# Patient Record
Sex: Female | Born: 1980 | Race: Black or African American | Hispanic: No | Marital: Single | Smoking: Never smoker
Health system: Southern US, Community
[De-identification: ages and names within clinical notes are randomized; demographics above are authoritative.]

## PROBLEM LIST (undated history)

## (undated) ENCOUNTER — Inpatient Hospital Stay (HOSPITAL_COMMUNITY): Payer: Self-pay

## (undated) DIAGNOSIS — F329 Major depressive disorder, single episode, unspecified: Secondary | ICD-10-CM

## (undated) DIAGNOSIS — Z8669 Personal history of other diseases of the nervous system and sense organs: Secondary | ICD-10-CM

## (undated) DIAGNOSIS — F32A Depression, unspecified: Secondary | ICD-10-CM

## (undated) DIAGNOSIS — B009 Herpesviral infection, unspecified: Secondary | ICD-10-CM

## (undated) HISTORY — PX: INDUCED ABORTION: SHX677

## (undated) HISTORY — DX: Personal history of other diseases of the nervous system and sense organs: Z86.69

## (undated) HISTORY — PX: TUBAL LIGATION: SHX77

## (undated) HISTORY — DX: Herpesviral infection, unspecified: B00.9

## (undated) HISTORY — PX: NO PAST SURGERIES: SHX2092

---

## 2007-08-10 ENCOUNTER — Emergency Department (HOSPITAL_COMMUNITY): Admission: EM | Admit: 2007-08-10 | Discharge: 2007-08-10 | Payer: Self-pay | Admitting: Emergency Medicine

## 2009-02-01 ENCOUNTER — Inpatient Hospital Stay (HOSPITAL_COMMUNITY): Admission: AD | Admit: 2009-02-01 | Discharge: 2009-02-03 | Payer: Self-pay | Admitting: Obstetrics and Gynecology

## 2009-09-10 ENCOUNTER — Emergency Department (HOSPITAL_COMMUNITY): Admission: EM | Admit: 2009-09-10 | Discharge: 2009-09-10 | Payer: Self-pay | Admitting: Emergency Medicine

## 2010-06-02 LAB — WET PREP, GENITAL
Trich, Wet Prep: NONE SEEN
Yeast Wet Prep HPF POC: NONE SEEN

## 2010-06-02 LAB — RPR: RPR Ser Ql: NONREACTIVE

## 2010-06-02 LAB — CBC
MCHC: 33 g/dL (ref 30.0–36.0)
MCV: 92.5 fL (ref 78.0–100.0)
Platelets: 233 10*3/uL (ref 150–400)
Platelets: 268 10*3/uL (ref 150–400)
RDW: 13.8 % (ref 11.5–15.5)

## 2010-11-26 LAB — POCT PREGNANCY, URINE: Operator id: 257131

## 2011-10-21 ENCOUNTER — Encounter (HOSPITAL_COMMUNITY): Payer: Self-pay | Admitting: Emergency Medicine

## 2011-10-21 ENCOUNTER — Emergency Department (INDEPENDENT_AMBULATORY_CARE_PROVIDER_SITE_OTHER): Payer: Self-pay

## 2011-10-21 ENCOUNTER — Emergency Department (HOSPITAL_COMMUNITY)
Admission: EM | Admit: 2011-10-21 | Discharge: 2011-10-21 | Disposition: A | Discharge status: Home / Self Care | Payer: PRIVATE HEALTH INSURANCE | Source: Home / Self Care | Attending: Emergency Medicine | Admitting: Emergency Medicine

## 2011-10-21 DIAGNOSIS — IMO0002 Reserved for concepts with insufficient information to code with codable children: Secondary | ICD-10-CM

## 2011-10-21 DIAGNOSIS — S6000XA Contusion of unspecified finger without damage to nail, initial encounter: Secondary | ICD-10-CM

## 2011-10-21 DIAGNOSIS — S60419A Abrasion of unspecified finger, initial encounter: Secondary | ICD-10-CM

## 2011-10-21 MED ORDER — BACITRACIN ZINC 500 UNIT/GM EX OINT
TOPICAL_OINTMENT | Freq: Once | CUTANEOUS | Status: AC
  Administered 2011-10-21: 19:00:00 via TOPICAL

## 2011-10-21 NOTE — ED Provider Notes (Addendum)
History     CSN: [id number]  Arrival date & time 10/21/11  1750   First MD Initiated Contact with Patient 10/21/11 1752      Chief Complaint  Patient presents with  . Hand Pain    (Consider location/radiation/quality/duration/timing/severity/associated sxs/prior treatment) HPI Comments: Patient presents urgent care this evening after she has sustained an injury to right middle finger she accidentally closed a car of her door and slammed her right middle finger. Has a small wound laceration to the dorsal aspect of the right middle finger. She denies any numbness, or tingling sensation. Is able to move her finger but with discomfort.  Patient is a 31 y.o. female presenting with hand pain. The history is provided by the patient.  Hand Pain This is a new problem. The current episode started 3 to 5 hours ago. The problem occurs constantly. The problem has not changed since onset.Pertinent negatives include no chest pain, no abdominal pain, no headaches and no shortness of breath. Nothing aggravates the symptoms. She has tried nothing for the symptoms.    History reviewed. No pertinent past medical history.  History reviewed. No pertinent past surgical history.  History reviewed. No pertinent family history.  History  Substance Use Topics  . Smoking status: Never Smoker   . Smokeless tobacco: Not on file  . Alcohol Use: No    OB History    Grav Para Term Preterm Abortions TAB SAB Ect Mult Living                  Review of Systems  Constitutional: Positive for activity change. Negative for chills and appetite change.  Respiratory: Negative for shortness of breath.   Cardiovascular: Negative for chest pain.  Gastrointestinal: Negative for abdominal pain.  Musculoskeletal: Positive for joint swelling. Negative for myalgias, back pain, arthralgias and gait problem.  Skin: Positive for wound.  Neurological: Negative for headaches.    Allergies  Review of patient's allergies  indicates no known allergies.  Home Medications  No current outpatient prescriptions on file.  BP 94/54  Pulse 67  Temp 98.4 F (36.9 C) (Oral)  Resp 16  SpO2 67%  LMP 09/20/2011  Physical Exam  Nursing note and vitals reviewed. Constitutional: She appears well-developed and well-nourished.  Musculoskeletal: She exhibits tenderness.       Hands: Neurological: She is alert.  Skin: Skin is warm.    ED Course  Procedures (including critical care time)  Labs Reviewed - No data to display Dg Finger Middle Right  10/21/2011  *RADIOLOGY REPORT*  Clinical Data: Trauma today with distal pain.  RIGHT MIDDLE FINGER 2+V  Comparison: None.  Findings: No acute fracture or dislocation.  No radio-opaque foreign body.  IMPRESSION: No acute osseous abnormality.   Original Report Authenticated By: Consuello Bossier, M.D.      1. Abrasion of finger   2. Contusion of finger       MDM  Right middle finger contusion with superficial abrasion. Negative x-rays. Have provided a finger splint to be used for 5 days. We have discussed symptoms specially range of motion and that if any deficit or unable to fully extend and flex her third finger to return for recheck.     Jimmie Molly, MD 10/21/11 [place]  Jimmie Molly, MD 10/21/11 [phone]

## 2011-10-21 NOTE — ED Notes (Signed)
Closed right middle finger in car door around 4:30 p.m.Marland Kitchen  small amount of drainage from wound, laceration to base of nail, cuticle, finger

## 2011-10-21 NOTE — ED Notes (Signed)
Clarified length of time to remain in splint-dr coll reports 3 days.

## 2012-05-04 ENCOUNTER — Telehealth: Payer: Self-pay | Admitting: Obstetrics and Gynecology

## 2012-05-04 NOTE — Telephone Encounter (Signed)
VM from pt.05/03/12. Has appt 05/09/12. Needs RF Valacyclovir. Pt [id number].

## 2012-05-04 NOTE — Telephone Encounter (Signed)
VM from pt. 05/03/12. States has app 05/09/12. Needs Rf Valacyclovir.  Pt [id number].

## 2012-05-09 ENCOUNTER — Ambulatory Visit: Payer: PRIVATE HEALTH INSURANCE | Admitting: Obstetrics and Gynecology

## 2012-05-09 ENCOUNTER — Encounter: Payer: Self-pay | Admitting: Obstetrics and Gynecology

## 2012-05-09 VITALS — BP 108/70 | Temp 98.0°F | Ht 67.5 in | Wt 146.0 lb

## 2012-05-09 DIAGNOSIS — Z124 Encounter for screening for malignant neoplasm of cervix: Secondary | ICD-10-CM

## 2012-05-09 DIAGNOSIS — N926 Irregular menstruation, unspecified: Secondary | ICD-10-CM

## 2012-05-09 MED ORDER — ESTRADIOL 1 MG PO TABS: ORAL_TABLET | ORAL | Status: DC

## 2012-05-09 MED ORDER — ACYCLOVIR 200 MG PO CAPS: ORAL_CAPSULE | ORAL | Status: DC

## 2012-05-09 NOTE — Patient Instructions (Signed)
Avoid: - excess soap on genital area (consider using plain oatmeal soap) - use of powder or sprays in genital area - douching - wearing underwear to bed (except with menses) - using more than is directed detergent when washing clothes - tight fitting garments around genital area - excess sugar intake   

## 2012-05-09 NOTE — Progress Notes (Signed)
Regular Periods: yes Mammogram: no  Monthly Breast Ex.: yes Exercise: no  Tetanus < 10 years: yes Seatbelts: yes  NI. Bladder Functn.: yes weak bladder Abuse at home: no  Daily BM's: yes Stressful Work: no  Healthy Diet: yes Sigmoid-Colonoscopy: no  Calcium: no Medical problems this year: irregular bleeding and vaginal discharge   LAST PAP:8/12  Contraception: implanon  Mammogram:  no  PCP: no  PMH: no change  FMH: no change  Last Bone Scan: no  Pt is single.

## 2012-05-09 NOTE — Progress Notes (Signed)
Subjective:    [name] [name] is a 32 y.o. female, G6P0, who presents for an annual exam. The patient reports irregular bleeding (continues) on Implanon (expires August this year) and vaginal discharge.  Describes bleeding a the need to change pad 3 times a day x 2 weeks.  Denies urinary tract symptoms, change in bowel function or fever.  States that vaginal discharge is not smelly or itch.  Menstrual cycle:   LMP: Patient's last menstrual period was 04/13/2012.             Review of Systems Pertinent items are noted in HPI. Denies pelvic pain, urinary tract symptoms, vaginitis symptoms, irregular bleeding, menopausal symptoms, change in bowel habits or rectal bleeding   Objective:    BP 108/70  Temp(Src) 98 F (36.7 C) (Oral)  Ht 5' 7.5" (1.715 m)  Wt 146 lb (66.225 kg)  BMI 22.52 kg/m2  LMP 04/13/2012     Wt Readings from Last 1 Encounters:  05/09/12 146 lb (66.225 kg)   Body mass index is 22.52 kg/(m^2). General Appearance: Alert, no acute distress HEENT: Grossly normal Neck / Thyroid: Supple, no thyromegaly or cervical adenopathy Lungs: Clear to auscultation bilaterally Back: No CVA tenderness Breast Exam: No masses or nodes.No dimpling, nipple retraction or discharge. Cardiovascular: Regular rate and rhythm.  Gastrointestinal: Soft, non-tender, no masses or organomegaly Pelvic Exam: EGBUS-wnl, vagina-normal rugae, cervix- without lesions or tenderness, uterus appears normal size shape and consistency, adnexae-no masses or tenderness Lymphatic Exam: Non-palpable nodes in neck, clavicular,  axillary, or inguinal regions  Skin: no rashes but multiple scars on the legs Extremities: no clubbing cyanosis or edema  Neurologic: grossly normal Psychiatric: Alert and oriented  UPT-negative   Assessment:   Routine GYN Exam Irregular Bleeding on Implanon (expires August 2014)   Plan:  Estradiol 1 mg #21  1 po qd 1 refills  Refer to Plastic Surgeon for breast enhancement  surgery consult and to Dermatologist for management of scars on legs   PAP sent  RTO 1 year or prn  POWELL,ELMIRAPA-C

## 2012-05-10 LAB — PAP IG W/ RFLX HPV ASCU

## 2013-05-28 ENCOUNTER — Emergency Department (INDEPENDENT_AMBULATORY_CARE_PROVIDER_SITE_OTHER)
Admission: EM | Admit: 2013-05-28 | Discharge: 2013-05-28 | Disposition: A | Discharge status: Home / Self Care | Payer: PRIVATE HEALTH INSURANCE | Source: Home / Self Care | Attending: Emergency Medicine | Admitting: Emergency Medicine

## 2013-05-28 ENCOUNTER — Encounter (HOSPITAL_COMMUNITY): Payer: Self-pay | Admitting: Emergency Medicine

## 2013-05-28 ENCOUNTER — Emergency Department (INDEPENDENT_AMBULATORY_CARE_PROVIDER_SITE_OTHER): Payer: PRIVATE HEALTH INSURANCE

## 2013-05-28 DIAGNOSIS — F411 Generalized anxiety disorder: Secondary | ICD-10-CM

## 2013-05-28 DIAGNOSIS — F419 Anxiety disorder, unspecified: Secondary | ICD-10-CM

## 2013-05-28 MED ORDER — LORAZEPAM 1 MG PO TABS: 1.0000 mg | ORAL_TABLET | Freq: Three times a day (TID) | ORAL | Status: DC | PRN

## 2013-05-28 NOTE — Discharge Instructions (Signed)

## 2013-05-28 NOTE — ED Provider Notes (Signed)
Chief Complaint   Chief Complaint  Patient presents with  . Chest Pain    History of Present Illness    [name] [name] is a 33 year old female who around 1 PM today developed substernal chest tightness with radiation through to the back. She denies any pain. States it feels like a tightness. She feels mildly short of breath. She denies any coughing or wheezing the patient has had similar episodes in the past. She's felt mildly depressed, she's been under some stress from family issues, and has felt tired and rundown. She denies any fever, chills, headache, URI symptoms, coughing, wheezing, palpitations, dizziness, syncope, or GI symptoms.  Review of Systems    Other than noted above, the patient denies any of the following symptoms. Systemic:  No fever or chills. Pulmonary:  No cough, wheezing, shortness of breath, sputum production, hemoptysis. Cardiac:  No palpitations, rapid heartbeat, dizziness, presyncope or syncope. GI:  No abdominal pain, heartburn, nausea, or vomiting. Ext:  No leg pain or swelling.  PMFSH    Past medical history, family history, social history, meds, and allergies were reviewed.   Physical Exam     Vital signs:  BP 130/72  Pulse 74  Temp(Src) 98.6 F (37 C) (Oral)  Resp 18  SpO2 100% Gen:  Alert, oriented, in no distress, skin warm and dry. Eye:  PERRL, lids and conjunctivas normal.  Sclera non-icteric. ENT:  Mucous membranes moist, pharynx clear. Neck:  Supple, no adenopathy or tenderness.  No JVD. Lungs:  Clear to auscultation, no wheezes, rales or rhonchi.  No respiratory distress. Heart:  Regular rhythm.  No gallops, murmers, clicks or rubs. Chest:  No chest wall tenderness. Abdomen:  Soft, nontender, no organomegaly or mass.  Bowel sounds normal.  No pulsatile abdominal mass or bruit. Ext:  No edema.  No calf tenderness and Homann's sign negative.  Pulses full and equal. Skin:  Warm and dry.  No rash.  Radiology     Dg Chest 2  View  05/28/2013   CLINICAL DATA:  Chest tightness  EXAM: CHEST  2 VIEW  COMPARISON:  August 10, 2007  FINDINGS: Upper thoracic levoscoliosis with mid and lower thoracic dextroscoliosis is again noted. Lungs are clear. Heart size and pulmonary vascularity are normal. No adenopathy. No pneumothorax.  IMPRESSION: Scoliosis.  Lungs clear.   Electronically Signed   By: Bretta BangWilliam  Woodruff M.D.   On: 05/28/2013 15:41   I reviewed the images independently and personally and concur with the radiologist's findings.  Electrocardiogram     Date: 05/28/2013  Rate: 61  Rhythm: normal sinus rhythm  QRS Axis: normal  Intervals: normal  ST/T Wave abnormalities: normal  Conduction Disutrbances:none  Narrative Interpretation: Normal sinus rhythm, normal EKG.  Old EKG Reviewed: none available  Assessment     The encounter diagnosis was Anxiety.  No evidence of cardiopulmonary disease.  Plan     1.  Meds:  The following meds were prescribed:   Discharge Medication List as of 05/28/2013  3:56 PM    START taking these medications   Details  LORazepam (ATIVAN) 1 MG tablet Take 1 tablet (1 mg total) by mouth 3 (three) times daily as needed for anxiety., Starting 05/28/2013, Until Discontinued, Print        2.  Patient Education/Counseling:  The patient was given appropriate handouts, self care instructions, and instructed in symptomatic relief.    3.  Follow up:  The patient was told to follow up here if no better in 3 to 4 days, or sooner if becoming worse in any way, and give an an some red flag symptoms such as worsening pain, shortness of breath, dizziness, or passing out which would prompt immediate return. Followup with primary care doctor within the next week.     Reuben Likes, MD 05/28/13 2157

## 2013-05-28 NOTE — ED Notes (Signed)
Pt  Reports        midsternal chest   Pain             That  Started  Today    At  Work             She  Reports         Feels  Like  Her  Chest is  Caving in   She       Ambulated  To  Room  With a  Steady  Fluid  Gait  He  Skin is  Warm  And  Dry       She  Is  Speaking in  Complete  sentances

## 2013-10-30 LAB — OB RESULTS CONSOLE HEPATITIS B SURFACE ANTIGEN: Hepatitis B Surface Ag: NEGATIVE

## 2013-10-30 LAB — OB RESULTS CONSOLE ABO/RH: RH Type: POSITIVE

## 2013-10-30 LAB — OB RESULTS CONSOLE GC/CHLAMYDIA
CHLAMYDIA, DNA PROBE: NEGATIVE
Gonorrhea: NEGATIVE

## 2013-10-30 LAB — OB RESULTS CONSOLE ANTIBODY SCREEN: Antibody Screen: NEGATIVE

## 2013-10-30 LAB — OB RESULTS CONSOLE RUBELLA ANTIBODY, IGM: Rubella: IMMUNE

## 2013-10-30 LAB — OB RESULTS CONSOLE HIV ANTIBODY (ROUTINE TESTING): HIV: NONREACTIVE

## 2013-10-30 LAB — OB RESULTS CONSOLE RPR: RPR: NONREACTIVE

## 2013-12-31 ENCOUNTER — Encounter (HOSPITAL_COMMUNITY): Payer: Self-pay | Admitting: Emergency Medicine

## 2014-02-13 LAB — OB RESULTS CONSOLE GBS: GBS: NEGATIVE

## 2014-03-01 NOTE — L&D Delivery Note (Signed)
Delivery Note At 11:25 PM a viable female was delivered via Vaginal, Spontaneous Delivery (Presentation: ;  ).  APGAR: , ; weight  .   Placenta status: , .  Cord:  with the following complications: .  Cord pH: not done  Anesthesia: Epidural  Episiotomy:   Lacerations:   Suture Repair: 2.0 Est. Blood Loss (mL):    Mom to postpartum.  Baby to Couplet care / Skin to Skin.  Shona Pardo A 04/30/2014, 11:34 PM

## 2014-04-02 ENCOUNTER — Inpatient Hospital Stay (HOSPITAL_COMMUNITY)
Admission: AD | Admit: 2014-04-02 | Discharge: 2014-04-02 | Disposition: A | Discharge status: Home / Self Care | Payer: Commercial Managed Care - PPO | Source: Ambulatory Visit | Attending: Obstetrics | Admitting: Obstetrics

## 2014-04-02 ENCOUNTER — Encounter (HOSPITAL_COMMUNITY): Payer: Self-pay | Admitting: *Deleted

## 2014-04-02 DIAGNOSIS — O4703 False labor before 37 completed weeks of gestation, third trimester: Secondary | ICD-10-CM | POA: Diagnosis not present

## 2014-04-02 DIAGNOSIS — O26899 Other specified pregnancy related conditions, unspecified trimester: Secondary | ICD-10-CM

## 2014-04-02 DIAGNOSIS — Z3A36 36 weeks gestation of pregnancy: Secondary | ICD-10-CM | POA: Diagnosis not present

## 2014-04-02 DIAGNOSIS — O9989 Other specified diseases and conditions complicating pregnancy, childbirth and the puerperium: Secondary | ICD-10-CM | POA: Diagnosis not present

## 2014-04-02 DIAGNOSIS — R109 Unspecified abdominal pain: Secondary | ICD-10-CM

## 2014-04-02 HISTORY — DX: Major depressive disorder, single episode, unspecified: F32.9

## 2014-04-02 HISTORY — DX: Herpesviral infection, unspecified: B00.9

## 2014-04-02 HISTORY — DX: Depression, unspecified: F32.A

## 2014-04-02 LAB — URINALYSIS, ROUTINE W REFLEX MICROSCOPIC
BILIRUBIN URINE: NEGATIVE
GLUCOSE, UA: NEGATIVE mg/dL
Hgb urine dipstick: NEGATIVE
KETONES UR: 15 mg/dL — AB
Nitrite: NEGATIVE
PROTEIN: NEGATIVE mg/dL
UROBILINOGEN UA: 2 mg/dL — AB (ref 0.0–1.0)
pH: 6 (ref 5.0–8.0)

## 2014-04-02 LAB — URINE MICROSCOPIC-ADD ON

## 2014-04-02 NOTE — MAU Note (Addendum)
Pain in rt lower abd, not having it now. Comes and goes.  Started when she was at work

## 2014-04-02 NOTE — MAU Provider Note (Signed)
  History     CSN: [id number]  Arrival date and time: 04/02/14 1339   None     Chief Complaint  Patient presents with  . Abdominal Pain   HPI [name] [name] 34 y.o. [id number] @[redacted]w[redacted]d  presents to MAU complaining of sharp pains on right lower abdomen - feeling tightness x 1 hour.  She believes they are Braxton-Hicks contractions.  She denies nausea, vomiting, vaginal bleeding, LOF, dysuria.  Endorses good fetal movement and complains of feet swelling from long periods standing. OB History    Gravida Para Term Preterm AB TAB SAB Ectopic Multiple Living   8 4 4  0 3 3 0   4      Past Medical History  Diagnosis Date  . HSV-2 infection   . Hx of migraines   . Depression     ok now  . Herpes     on valtrex    Past Surgical History  Procedure Laterality Date  . Induced abortion      3 elective  . No past surgeries      Family History  Problem Relation Age of Onset  . Stroke Father   . Diabetes Brother   . Leukemia Maternal Aunt   . Diabetes Paternal Uncle   . Pneumonia Maternal Grandmother   . Cancer Maternal Grandfather     lungs  . Diabetes Paternal Grandmother   . Diabetes Paternal Grandfather     History  Substance Use Topics  . Smoking status: Never Smoker   . Smokeless tobacco: Never Used  . Alcohol Use: No    Allergies: No Known Allergies  Prescriptions prior to admission  Medication Sig Dispense Refill Last Dose  . acyclovir (ZOVIRAX) 200 MG capsule 1 po tid x 5 days as needed. 30 capsule 11   . estradiol (ESTRACE) 1 MG tablet 1 po daily x 21 days, stop x 7 days then repeat for another 21 days 21 tablet 1   . LORazepam (ATIVAN) 1 MG tablet Take 1 tablet (1 mg total) by mouth 3 (three) times daily as needed for anxiety. 30 tablet 0     ROS Pertinent ROS in HPI   Physical Exam   Blood pressure 112/70, pulse 75, temperature 97.8 F (36.6 C), temperature source Oral, resp. rate 18, weight 157 lb (71.215 kg).  Physical Exam  Constitutional: She is  oriented to person, place, and time. She appears well-developed and well-nourished.  HENT:  Head: Normocephalic and atraumatic.  Eyes: EOM are normal.  Cardiovascular: Normal rate and regular rhythm.   Respiratory: Effort normal and breath sounds normal. No respiratory distress.  GI: Soft. Bowel sounds are normal. She exhibits no distension. There is no tenderness. There is no rebound and no guarding.  Musculoskeletal: Normal range of motion.  Neurological: She is alert and oriented to person, place, and time.  Skin: Skin is warm and dry.  Psychiatric: She has a normal mood and affect.   Fetal Tracing: Baseline:125 Variability:mod Accelerations: 15x15 Decelerations:none Toco:irritable   MAU Course  Procedures  MDM Pain resolved.  No further concerns.   Fetal tracing is reactive.    Assessment and Plan  A:  1. Abdominal pain in pregnancy   Braxton Hicks  P: Discharge to home F/u in clinic as scheduled/as needed Encourage po hydration Patient may return to MAU as needed or if her condition were to change or worsen   Bertram Denvereague Clark, Karen E 04/02/2014, 2:28 PM

## 2014-04-02 NOTE — Discharge Instructions (Signed)
Abdominal Pain During Pregnancy °Belly (abdominal) pain is common during pregnancy. Most of the time, it is not a serious problem. Other times, it can be a sign that something is wrong with the pregnancy. Always tell your doctor if you have belly pain. °HOME CARE °Monitor your belly pain for any changes. The following actions may help you feel better: °· Do not have sex (intercourse) or put anything in your vagina until you feel better. °· Rest until your pain stops. °· Drink clear fluids if you feel sick to your stomach (nauseous). Do not eat solid food until you feel better. °· Only take medicine as told by your doctor. °· Keep all doctor visits as told. °GET HELP RIGHT AWAY IF:  °· You are bleeding, leaking fluid, or pieces of tissue come out of your vagina. °· You have more pain or cramping. °· You keep throwing up (vomiting). °· You have pain when you pee (urinate) or have blood in your pee. °· You have a fever. °· You do not feel your baby moving as much. °· You feel very weak or feel like passing out. °· You have trouble breathing, with or without belly pain. °· You have a very bad headache and belly pain. °· You have fluid leaking from your vagina and belly pain. °· You keep having watery poop (diarrhea). °· Your belly pain does not go away after resting, or the pain gets worse. °MAKE SURE YOU:  °· Understand these instructions. °· Will watch your condition. °· Will get help right away if you are not doing well or get worse. °Document Released: 02/03/2009 Document Revised: 10/18/2012 Document Reviewed: 09/14/2012 °[employer]® Patient Information ©2015 [employer]. This information is not intended to replace advice given to you by your health care provider. Make sure you discuss any questions you have with your health care provider. ° °

## 2014-04-26 ENCOUNTER — Inpatient Hospital Stay (HOSPITAL_COMMUNITY)
Admission: AD | Admit: 2014-04-26 | Discharge: 2014-04-26 | Disposition: A | Discharge status: Home / Self Care | Payer: Commercial Managed Care - PPO | Source: Ambulatory Visit | Attending: Obstetrics | Admitting: Obstetrics

## 2014-04-26 ENCOUNTER — Encounter (HOSPITAL_COMMUNITY): Payer: Self-pay | Admitting: *Deleted

## 2014-04-26 DIAGNOSIS — Z3A38 38 weeks gestation of pregnancy: Secondary | ICD-10-CM | POA: Insufficient documentation

## 2014-04-26 DIAGNOSIS — O471 False labor at or after 37 completed weeks of gestation: Secondary | ICD-10-CM | POA: Insufficient documentation

## 2014-04-26 NOTE — MAU Note (Signed)
Pt presents to MAU with complaints of contractions that started this morning around 8. Denies any vaginal bleeding, reports some LOF

## 2014-04-26 NOTE — Discharge Instructions (Signed)
Braxton Hicks Contractions °Contractions of the uterus can occur throughout pregnancy. Contractions are not always a sign that you are in labor.  °WHAT ARE BRAXTON HICKS CONTRACTIONS?  °Contractions that occur before labor are called Braxton Hicks contractions, or false labor. Toward the end of pregnancy (32-34 weeks), these contractions can develop more often and may become more forceful. This is not true labor because these contractions do not result in opening (dilatation) and thinning of the cervix. They are sometimes difficult to tell apart from true labor because these contractions can be forceful and people have different pain tolerances. You should not feel embarrassed if you go to the hospital with false labor. Sometimes, the only way to tell if you are in true labor is for your health care provider to look for changes in the cervix. °If there are no prenatal problems or other health problems associated with the pregnancy, it is completely safe to be sent home with false labor and await the onset of true labor. °HOW CAN YOU TELL THE DIFFERENCE BETWEEN TRUE AND FALSE LABOR? °False Labor °· The contractions of false labor are usually shorter and not as hard as those of true labor.   °· The contractions are usually irregular.   °· The contractions are often felt in the front of the lower abdomen and in the groin.   °· The contractions may go away when you walk around or change positions while lying down.   °· The contractions get weaker and are shorter lasting as time goes on.   °· The contractions do not usually become progressively stronger, regular, and closer together as with true labor.   °True Labor °1. Contractions in true labor last 30-70 seconds, become very regular, usually become more intense, and increase in frequency.   °2. The contractions do not go away with walking.   °3. The discomfort is usually felt in the top of the uterus and spreads to the lower abdomen and low back.   °4. True labor can  be determined by your health care provider with an exam. This will show that the cervix is dilating and getting thinner.   °WHAT TO REMEMBER °· Keep up with your usual exercises and follow other instructions given by your health care provider.   °· Take medicines as directed by your health care provider.   °· Keep your regular prenatal appointments.   °· Eat and drink lightly if you think you are going into labor.   °· If Braxton Hicks contractions are making you uncomfortable:   °· Change your position from lying down or resting to walking, or from walking to resting.   °· Sit and rest in a tub of warm water.   °· Drink 2-3 glasses of water. Dehydration may cause these contractions.   °· Do slow and deep breathing several times an hour.   °WHEN SHOULD I SEEK IMMEDIATE MEDICAL CARE? °Seek immediate medical care if: °· Your contractions become stronger, more regular, and closer together.   °· You have fluid leaking or gushing from your vagina.   °· You have a fever.   °· You pass blood-tinged mucus.   °· You have vaginal bleeding.   °· You have continuous abdominal pain.   °· You have low back pain that you never had before.   °· You feel your baby's head pushing down and causing pelvic pressure.   °· Your baby is not moving as much as it used to.   °Document Released: 02/15/2005 Document Revised: 02/20/2013 Document Reviewed: 11/27/2012 °[employer]® Patient Information ©2015 [employer]. This information is not intended to replace advice given to you by your health care   provider. Make sure you discuss any questions you have with your health care provider. ° °Fetal Movement Counts °Patient Name: __________________________________________________ Patient Due Date: ____________________ °Performing a fetal movement count is highly recommended in high-risk pregnancies, but it is good for every pregnant woman to do. Your health care provider may ask you to start counting fetal movements at 28 weeks of the pregnancy. Fetal  movements often increase: °· After eating a full meal. °· After physical activity. °· After eating or drinking something sweet or cold. °· At rest. °Pay attention to when you feel the baby is most active. This will help you notice a pattern of your baby's sleep and wake cycles and what factors contribute to an increase in fetal movement. It is important to perform a fetal movement count at the same time each day when your baby is normally most active.  °HOW TO COUNT FETAL MOVEMENTS °5. Find a quiet and comfortable area to sit or lie down on your left side. Lying on your left side provides the best blood and oxygen circulation to your baby. °6. Write down the day and time on a sheet of paper or in a journal. °7. Start counting kicks, flutters, swishes, rolls, or jabs in a 2-hour period. You should feel at least 10 movements within 2 hours. °8. If you do not feel 10 movements in 2 hours, wait 2-3 hours and count again. Look for a change in the pattern or not enough counts in 2 hours. °SEEK MEDICAL CARE IF: °· You feel less than 10 counts in 2 hours, tried twice. °· There is no movement in over an hour. °· The pattern is changing or taking longer each day to reach 10 counts in 2 hours. °· You feel the baby is not moving as he or she usually does. °Date: ____________ Movements: ____________ Start time: ____________ Finish time: ____________  °Date: ____________ Movements: ____________ Start time: ____________ Finish time: ____________ °Date: ____________ Movements: ____________ Start time: ____________ Finish time: ____________ °Date: ____________ Movements: ____________ Start time: ____________ Finish time: ____________ °Date: ____________ Movements: ____________ Start time: ____________ Finish time: ____________ °Date: ____________ Movements: ____________ Start time: ____________ Finish time: ____________ °Date: ____________ Movements: ____________ Start time: ____________ Finish time: ____________ °Date: ____________  Movements: ____________ Start time: ____________ Finish time: ____________  °Date: ____________ Movements: ____________ Start time: ____________ Finish time: ____________ °Date: ____________ Movements: ____________ Start time: ____________ Finish time: ____________ °Date: ____________ Movements: ____________ Start time: ____________ Finish time: ____________ °Date: ____________ Movements: ____________ Start time: ____________ Finish time: ____________ °Date: ____________ Movements: ____________ Start time: ____________ Finish time: ____________ °Date: ____________ Movements: ____________ Start time: ____________ Finish time: ____________ °Date: ____________ Movements: ____________ Start time: ____________ Finish time: ____________  °Date: ____________ Movements: ____________ Start time: ____________ Finish time: ____________ °Date: ____________ Movements: ____________ Start time: ____________ Finish time: ____________ °Date: ____________ Movements: ____________ Start time: ____________ Finish time: ____________ °Date: ____________ Movements: ____________ Start time: ____________ Finish time: ____________ °Date: ____________ Movements: ____________ Start time: ____________ Finish time: ____________ °Date: ____________ Movements: ____________ Start time: ____________ Finish time: ____________ °Date: ____________ Movements: ____________ Start time: ____________ Finish time: ____________  °Date: ____________ Movements: ____________ Start time: ____________ Finish time: ____________ °Date: ____________ Movements: ____________ Start time: ____________ Finish time: ____________ °Date: ____________ Movements: ____________ Start time: ____________ Finish time: ____________ °Date: ____________ Movements: ____________ Start time: ____________ Finish time: ____________ °Date: ____________ Movements: ____________ Start time: ____________ Finish time: ____________ °Date: ____________ Movements: ____________ Start time:  ____________ Finish time: ____________ °Date: ____________ Movements:   ____________ Start time: ____________ Finish time: ____________  °Date: ____________ Movements: ____________ Start time: ____________ Finish time: ____________ °Date: ____________ Movements: ____________ Start time: ____________ Finish time: ____________ °Date: ____________ Movements: ____________ Start time: ____________ Finish time: ____________ °Date: ____________ Movements: ____________ Start time: ____________ Finish time: ____________ °Date: ____________ Movements: ____________ Start time: ____________ Finish time: ____________ °Date: ____________ Movements: ____________ Start time: ____________ Finish time: ____________ °Date: ____________ Movements: ____________ Start time: ____________ Finish time: ____________  °Date: ____________ Movements: ____________ Start time: ____________ Finish time: ____________ °Date: ____________ Movements: ____________ Start time: ____________ Finish time: ____________ °Date: ____________ Movements: ____________ Start time: ____________ Finish time: ____________ °Date: ____________ Movements: ____________ Start time: ____________ Finish time: ____________ °Date: ____________ Movements: ____________ Start time: ____________ Finish time: ____________ °Date: ____________ Movements: ____________ Start time: ____________ Finish time: ____________ °Date: ____________ Movements: ____________ Start time: ____________ Finish time: ____________  °Date: ____________ Movements: ____________ Start time: ____________ Finish time: ____________ °Date: ____________ Movements: ____________ Start time: ____________ Finish time: ____________ °Date: ____________ Movements: ____________ Start time: ____________ Finish time: ____________ °Date: ____________ Movements: ____________ Start time: ____________ Finish time: ____________ °Date: ____________ Movements: ____________ Start time: ____________ Finish time: ____________ °Date:  ____________ Movements: ____________ Start time: ____________ Finish time: ____________ °Date: ____________ Movements: ____________ Start time: ____________ Finish time: ____________  °Date: ____________ Movements: ____________ Start time: ____________ Finish time: ____________ °Date: ____________ Movements: ____________ Start time: ____________ Finish time: ____________ °Date: ____________ Movements: ____________ Start time: ____________ Finish time: ____________ °Date: ____________ Movements: ____________ Start time: ____________ Finish time: ____________ °Date: ____________ Movements: ____________ Start time: ____________ Finish time: ____________ °Date: ____________ Movements: ____________ Start time: ____________ Finish time: ____________ °Document Released: 03/17/2006 Document Revised: 07/02/2013 Document Reviewed: 12/13/2011 °[employer]® Patient Information ©2015 [employer]. This information is not intended to replace advice given to you by your health care provider. Make sure you discuss any questions you have with your health care provider. ° °

## 2014-04-30 ENCOUNTER — Inpatient Hospital Stay (HOSPITAL_COMMUNITY): Payer: Medicaid Other | Admitting: Anesthesiology

## 2014-04-30 ENCOUNTER — Inpatient Hospital Stay (HOSPITAL_COMMUNITY)
Admission: AD | Admit: 2014-04-30 | Discharge: 2014-05-02 | DRG: 775 | Disposition: A | Discharge status: Home / Self Care | Payer: Medicaid Other | Source: Ambulatory Visit | Attending: Obstetrics | Admitting: Obstetrics

## 2014-04-30 ENCOUNTER — Encounter (HOSPITAL_COMMUNITY): Payer: Self-pay | Admitting: *Deleted

## 2014-04-30 DIAGNOSIS — O09523 Supervision of elderly multigravida, third trimester: Secondary | ICD-10-CM | POA: Diagnosis not present

## 2014-04-30 DIAGNOSIS — Z3483 Encounter for supervision of other normal pregnancy, third trimester: Secondary | ICD-10-CM | POA: Diagnosis present

## 2014-04-30 DIAGNOSIS — Z3A40 40 weeks gestation of pregnancy: Secondary | ICD-10-CM | POA: Diagnosis present

## 2014-04-30 DIAGNOSIS — O480 Post-term pregnancy: Principal | ICD-10-CM | POA: Diagnosis present

## 2014-04-30 LAB — TYPE AND SCREEN
ABO/RH(D): A POS
Antibody Screen: NEGATIVE

## 2014-04-30 LAB — CBC
HCT: 37.1 % (ref 36.0–46.0)
HEMOGLOBIN: 12.1 g/dL (ref 12.0–15.0)
MCH: 30.3 pg (ref 26.0–34.0)
MCHC: 32.6 g/dL (ref 30.0–36.0)
MCV: 93 fL (ref 78.0–100.0)
Platelets: 218 10*3/uL (ref 150–400)
RBC: 3.99 MIL/uL (ref 3.87–5.11)
RDW: 15.2 % (ref 11.5–15.5)
WBC: 6.2 10*3/uL (ref 4.0–10.5)

## 2014-04-30 LAB — ABO/RH: ABO/RH(D): A POS

## 2014-04-30 MED ORDER — ACETAMINOPHEN 325 MG PO TABS: 650.0000 mg | ORAL_TABLET | ORAL | Status: DC | PRN

## 2014-04-30 MED ORDER — BUTORPHANOL TARTRATE 1 MG/ML IJ SOLN
1.0000 mg | INTRAMUSCULAR | Status: DC | PRN
  Administered 2014-04-30 (×2): 1 mg via INTRAVENOUS
  Filled 2014-04-30 (×2): qty 1

## 2014-04-30 MED ORDER — LACTATED RINGERS IV SOLN
INTRAVENOUS | Status: DC
  Administered 2014-04-30 (×2): via INTRAVENOUS

## 2014-04-30 MED ORDER — OXYCODONE-ACETAMINOPHEN 5-325 MG PO TABS: 1.0000 | ORAL_TABLET | ORAL | Status: DC | PRN

## 2014-04-30 MED ORDER — LIDOCAINE HCL (PF) 1 % IJ SOLN
INTRAMUSCULAR | Status: DC | PRN
  Administered 2014-04-30 (×2): 4 mL

## 2014-04-30 MED ORDER — EPHEDRINE 5 MG/ML INJ
10.0000 mg | INTRAVENOUS | Status: DC | PRN
  Filled 2014-04-30: qty 2

## 2014-04-30 MED ORDER — OXYTOCIN BOLUS FROM INFUSION: 500.0000 mL | INTRAVENOUS | Status: DC

## 2014-04-30 MED ORDER — LACTATED RINGERS IV SOLN: 500.0000 mL | Freq: Once | INTRAVENOUS | Status: DC

## 2014-04-30 MED ORDER — BUTORPHANOL TARTRATE 1 MG/ML IJ SOLN: 1.0000 mg | INTRAMUSCULAR | Status: DC | PRN

## 2014-04-30 MED ORDER — TERBUTALINE SULFATE 1 MG/ML IJ SOLN: 0.2500 mg | Freq: Once | INTRAMUSCULAR | Status: AC | PRN

## 2014-04-30 MED ORDER — CITRIC ACID-SODIUM CITRATE 334-500 MG/5ML PO SOLN
30.0000 mL | ORAL | Status: DC | PRN
  Administered 2014-04-30: 30 mL via ORAL
  Filled 2014-04-30: qty 15

## 2014-04-30 MED ORDER — OXYTOCIN 40 UNITS IN LACTATED RINGERS INFUSION - SIMPLE MED: 62.5000 mL/h | INTRAVENOUS | Status: DC

## 2014-04-30 MED ORDER — OXYTOCIN 40 UNITS IN LACTATED RINGERS INFUSION - SIMPLE MED
1.0000 m[IU]/min | INTRAVENOUS | Status: DC
  Administered 2014-04-30: 2 m[IU]/min via INTRAVENOUS
  Filled 2014-04-30: qty 1000

## 2014-04-30 MED ORDER — FENTANYL 2.5 MCG/ML BUPIVACAINE 1/10 % EPIDURAL INFUSION (WH - ANES)
INTRAMUSCULAR | Status: DC | PRN
  Administered 2014-04-30: 14 mL/h via EPIDURAL

## 2014-04-30 MED ORDER — LIDOCAINE HCL (PF) 1 % IJ SOLN
30.0000 mL | INTRAMUSCULAR | Status: DC | PRN
  Filled 2014-04-30: qty 30

## 2014-04-30 MED ORDER — DIPHENHYDRAMINE HCL 50 MG/ML IJ SOLN: 12.5000 mg | INTRAMUSCULAR | Status: DC | PRN

## 2014-04-30 MED ORDER — LIDOCAINE HCL (PF) 1 % IJ SOLN
INTRAMUSCULAR | Status: DC | PRN
  Administered 2014-04-30: 5 mL via SUBCUTANEOUS

## 2014-04-30 MED ORDER — PHENYLEPHRINE 40 MCG/ML (10ML) SYRINGE FOR IV PUSH (FOR BLOOD PRESSURE SUPPORT)
80.0000 ug | PREFILLED_SYRINGE | INTRAVENOUS | Status: DC | PRN
Start: 2014-04-30 — End: 2014-05-01
  Filled 2014-04-30: qty 2

## 2014-04-30 MED ORDER — PHENYLEPHRINE 40 MCG/ML (10ML) SYRINGE FOR IV PUSH (FOR BLOOD PRESSURE SUPPORT)
80.0000 ug | PREFILLED_SYRINGE | INTRAVENOUS | Status: DC | PRN
  Filled 2014-04-30: qty 2
  Filled 2014-04-30 (×2): qty 20

## 2014-04-30 MED ORDER — ONDANSETRON HCL 4 MG/2ML IJ SOLN: 4.0000 mg | Freq: Four times a day (QID) | INTRAMUSCULAR | Status: DC | PRN

## 2014-04-30 MED ORDER — LACTATED RINGERS IV SOLN: 500.0000 mL | INTRAVENOUS | Status: DC | PRN

## 2014-04-30 MED ORDER — OXYCODONE-ACETAMINOPHEN 5-325 MG PO TABS: 2.0000 | ORAL_TABLET | ORAL | Status: DC | PRN

## 2014-04-30 MED ORDER — FENTANYL 2.5 MCG/ML BUPIVACAINE 1/10 % EPIDURAL INFUSION (WH - ANES)
14.0000 mL/h | INTRAMUSCULAR | Status: DC | PRN
  Filled 2014-04-30 (×2): qty 125

## 2014-04-30 NOTE — Anesthesia Preprocedure Evaluation (Signed)
Anesthesia Evaluation  Patient identified by MRN, date of birth, ID band Patient awake    Reviewed: Allergy & Precautions, NPO status , Patient's Chart, lab work & pertinent test results  History of Anesthesia Complications Negative for: history of anesthetic complications  Airway Mallampati: II  TM Distance: >3 FB Neck ROM: Full    Dental no notable dental hx. (+) Dental Advisory Given   Pulmonary neg pulmonary ROS,  breath sounds clear to auscultation  Pulmonary exam normal       Cardiovascular negative cardio ROS  Rhythm:Regular Rate:Normal     Neuro/Psych PSYCHIATRIC DISORDERS Anxiety Depression negative neurological ROS     GI/Hepatic negative GI ROS, Neg liver ROS,   Endo/Other  negative endocrine ROS  Renal/GU negative Renal ROS  negative genitourinary   Musculoskeletal negative musculoskeletal ROS (+)   Abdominal   Peds negative pediatric ROS (+)  Hematology negative hematology ROS (+)   Anesthesia Other Findings   Reproductive/Obstetrics (+) Pregnancy                             Anesthesia Physical Anesthesia Plan  ASA: II  Anesthesia Plan: Epidural   Post-op Pain Management:    Induction:   Airway Management Planned:   Additional Equipment:   Intra-op Plan:   Post-operative Plan:   Informed Consent: I have reviewed the patients History and Physical, chart, labs and discussed the procedure including the risks, benefits and alternatives for the proposed anesthesia with the patient or authorized representative who has indicated his/her understanding and acceptance.   Dental advisory given  Plan Discussed with:   Anesthesia Plan Comments:         Anesthesia Quick Evaluation  

## 2014-04-30 NOTE — Anesthesia Procedure Notes (Signed)
Epidural Patient location during procedure: OB Start time: 04/30/2014 6:32 PM  Staffing Anesthesiologist: Felipe DroneJUDD, Aseret Hoffman JENNETTE Performed by: anesthesiologist   Preanesthetic Checklist Completed: patient identified, site marked, surgical consent, pre-op evaluation, timeout performed, IV checked, risks and benefits discussed and monitors and equipment checked  Epidural Patient position: sitting Prep: site prepped and draped and DuraPrep Patient monitoring: continuous pulse ox and blood pressure Approach: midline Location: L3-L4 Injection technique: LOR saline  Needle:  Needle type: Tuohy  Needle gauge: 17 G Needle length: 9 cm and 9 Needle insertion depth: 6 cm Catheter type: closed end flexible Catheter size: 19 Gauge Catheter at skin depth: 10 cm Test dose: negative  Assessment Events: blood not aspirated, injection not painful, no injection resistance, negative IV test and no paresthesia  Additional Notes Patient identified. Risks/Benefits/Options discussed with patient including but not limited to bleeding, infection, nerve damage, paralysis, failed block, incomplete pain control, headache, blood pressure changes, nausea, vomiting, reactions to medication both or allergic, itching and postpartum back pain. Confirmed with bedside nurse the patient's most recent platelet count. Confirmed with patient that they are not currently taking any anticoagulation, have any bleeding history or any family history of bleeding disorders. Patient expressed understanding and wished to proceed. All questions were answered. Sterile technique was used throughout the entire procedure. Please see nursing notes for vital signs. Test dose was given through epidural catheter and negative prior to continuing to dose epidural or start infusion. Warning signs of high block given to the patient including shortness of breath, tingling/numbness in hands, complete motor block, or any concerning symptoms with  instructions to call for help. Patient was given instructions on fall risk and not to get out of bed. All questions and concerns addressed with instructions to call with any issues or inadequate analgesia.

## 2014-04-30 NOTE — Anesthesia Procedure Notes (Signed)
Epidural Patient location during procedure: OB Start time: 04/30/2014 3:55 PM  Staffing Anesthesiologist: Karie SchwalbeJUDD, Kenya Shiraishi JENNETTE Performed by: anesthesiologist   Preanesthetic Checklist Completed: patient identified, site marked, surgical consent, pre-op evaluation, timeout performed, IV checked, risks and benefits discussed and monitors and equipment checked  Epidural Patient position: sitting Prep: site prepped and draped and DuraPrep Patient monitoring: continuous pulse ox and blood pressure Approach: midline Location: L3-L4  Needle:  Needle type: Tuohy  Needle gauge: 17 G Needle length: 9 cm and 9  Assessment Events: blood not aspirated, injection not painful, no injection resistance, negative IV test and no paresthesia  Additional Notes All questions and concerns address. Consent obtained. Patient reports prior to epidural placement that she was "angry" with the last anesthesiologist because she had to stick her more than once. The patient was heavily contracting. Explained to her that she would have contractions throughout the procedure. I waited until a contraction subsided. I numbed her skin with lidocaine and she was screaming, moving all over the bed, arching her back drastically in the opposite direction away from me. I stopped and discussed with her the proper position to facilitate placement of her epidural. Another contraction came and she expressed how much she was hurting. Waited until that contraction stopped and inserted the touhy. Not even one cm into the skin, the patient began moving and arching her back. I asked if this was pressure sensation or pain. She reports that it was pressure but she couldn't sit still. I gave her additional local anesthetic and continued to advance the touhy. Before I was able to get LOR she continued to arch her back and have contractions that were too painful for her to continue. I discussed with her that I would stay and continue with the  epidural but for her safety and mine she would have to remain as still as possible. She asked me to stop the epidural and therefore I did immediately. I told her I could provide more numbing medicine if it was a discomfort to her and would continue but only if she wanted me to. She reported to myself and the nurse that she did not want to continue with the epidural and that she only wanted to use IV pain medicine. I told the patient that we were immediately available should she change her mind. I asked if there was anything I could do to help her or ease her into position or facilitate epidural placement but she declined. I placed a bandaid on her needle insertion site.

## 2014-04-30 NOTE — Anesthesia Preprocedure Evaluation (Signed)
Anesthesia Evaluation  Patient identified by MRN, date of birth, ID band Patient awake    Reviewed: Allergy & Precautions, NPO status , Patient's Chart, lab work & pertinent test results  History of Anesthesia Complications Negative for: history of anesthetic complications  Airway Mallampati: II  TM Distance: >3 FB Neck ROM: Full    Dental no notable dental hx. (+) Dental Advisory Given   Pulmonary neg pulmonary ROS,  breath sounds clear to auscultation  Pulmonary exam normal       Cardiovascular negative cardio ROS  Rhythm:Regular Rate:Normal  Reports intermittent palpitations   Neuro/Psych PSYCHIATRIC DISORDERS Anxiety Depression negative neurological ROS     GI/Hepatic negative GI ROS, Neg liver ROS,   Endo/Other  negative endocrine ROS  Renal/GU negative Renal ROS  negative genitourinary   Musculoskeletal negative musculoskeletal ROS (+)   Abdominal   Peds negative pediatric ROS (+)  Hematology negative hematology ROS (+)   Anesthesia Other Findings   Reproductive/Obstetrics (+) Pregnancy                             Anesthesia Physical Anesthesia Plan  ASA: II  Anesthesia Plan: Epidural   Post-op Pain Management:    Induction:   Airway Management Planned:   Additional Equipment:   Intra-op Plan:   Post-operative Plan:   Informed Consent: I have reviewed the patients History and Physical, chart, labs and discussed the procedure including the risks, benefits and alternatives for the proposed anesthesia with the patient or authorized representative who has indicated his/her understanding and acceptance.   Dental advisory given  Plan Discussed with: CRNA  Anesthesia Plan Comments:         Anesthesia Quick Evaluation

## 2014-04-30 NOTE — Progress Notes (Signed)
After req epidural Dr. Gentry RochJudd at the bedside pt refusing to cooperate with Dr. Gentry RochJudd instructions and asking us to stop. Pt states "I will just get the IV stuff". Dr. Gentry RochJudd left without placing epidural.

## 2014-04-30 NOTE — Progress Notes (Signed)
Pitocin stopped per Dr. Gaynell FaceMarshall. Wants pts UC's to slow down and for pt to consider epidural.

## 2014-04-30 NOTE — H&P (Signed)
This is Dr. Francoise CeoBernard Marshall dictating the history and physical on  [name] [name]  she's a 34 year old gravida 8 para 4037 at 40 weeks and 4 days negative GBS she has a history of herpes no all breaks recently she's been on Valtrex 500 by mouth daily membranes ruptured spontaneously at 7:30 today fluids clear she is having occasional contractions cervix 2 cm 70% vertex -2 and she was started on low-dose Pitocin Past medical history history of herpes Past surgical history negative Social history negative System review negative Physical exam well-developed female in no distress HEENT negative Lungs clear to P&A Heart regular rhythm no murmurs no gallops Breasts negative Abdomen term Pelvic as described above Extremities negative

## 2014-05-01 LAB — CBC
HEMATOCRIT: 31.6 % — AB (ref 36.0–46.0)
Hemoglobin: 10.4 g/dL — ABNORMAL LOW (ref 12.0–15.0)
MCH: 30.3 pg (ref 26.0–34.0)
MCHC: 32.9 g/dL (ref 30.0–36.0)
MCV: 92.1 fL (ref 78.0–100.0)
Platelets: 187 10*3/uL (ref 150–400)
RBC: 3.43 MIL/uL — ABNORMAL LOW (ref 3.87–5.11)
RDW: 15.1 % (ref 11.5–15.5)
WBC: 10.7 10*3/uL — ABNORMAL HIGH (ref 4.0–10.5)

## 2014-05-01 LAB — RPR: RPR Ser Ql: NONREACTIVE

## 2014-05-01 MED ORDER — SENNOSIDES-DOCUSATE SODIUM 8.6-50 MG PO TABS
2.0000 | ORAL_TABLET | ORAL | Status: DC
  Administered 2014-05-01: 2 via ORAL
  Filled 2014-05-01: qty 2

## 2014-05-01 MED ORDER — OXYCODONE-ACETAMINOPHEN 5-325 MG PO TABS
1.0000 | ORAL_TABLET | ORAL | Status: DC | PRN
Start: 2014-05-01 — End: 2014-05-02
  Administered 2014-05-01 (×3): 1 via ORAL
  Filled 2014-05-01 (×3): qty 1

## 2014-05-01 MED ORDER — IBUPROFEN 600 MG PO TABS
600.0000 mg | ORAL_TABLET | Freq: Four times a day (QID) | ORAL | Status: DC
  Administered 2014-05-01 – 2014-05-02 (×5): 600 mg via ORAL
  Filled 2014-05-01 (×6): qty 1

## 2014-05-01 MED ORDER — DIPHENHYDRAMINE HCL 25 MG PO CAPS: 25.0000 mg | ORAL_CAPSULE | Freq: Four times a day (QID) | ORAL | Status: DC | PRN

## 2014-05-01 MED ORDER — DIBUCAINE 1 % RE OINT: 1.0000 "application " | TOPICAL_OINTMENT | RECTAL | Status: DC | PRN

## 2014-05-01 MED ORDER — TETANUS-DIPHTH-ACELL PERTUSSIS 5-2.5-18.5 LF-MCG/0.5 IM SUSP: 0.5000 mL | Freq: Once | INTRAMUSCULAR | Status: DC

## 2014-05-01 MED ORDER — ONDANSETRON HCL 4 MG/2ML IJ SOLN
4.0000 mg | INTRAMUSCULAR | Status: DC | PRN
Start: 2014-05-01 — End: 2014-05-02

## 2014-05-01 MED ORDER — WITCH HAZEL-GLYCERIN EX PADS: 1.0000 "application " | MEDICATED_PAD | CUTANEOUS | Status: DC | PRN

## 2014-05-01 MED ORDER — BENZOCAINE-MENTHOL 20-0.5 % EX AERO: 1.0000 "application " | INHALATION_SPRAY | CUTANEOUS | Status: DC | PRN

## 2014-05-01 MED ORDER — ONDANSETRON HCL 4 MG PO TABS: 4.0000 mg | ORAL_TABLET | ORAL | Status: DC | PRN

## 2014-05-01 MED ORDER — OXYCODONE-ACETAMINOPHEN 5-325 MG PO TABS
2.0000 | ORAL_TABLET | ORAL | Status: DC | PRN
Start: 2014-05-01 — End: 2014-05-02

## 2014-05-01 MED ORDER — LANOLIN HYDROUS EX OINT: TOPICAL_OINTMENT | CUTANEOUS | Status: DC | PRN

## 2014-05-01 MED ORDER — FERROUS SULFATE 325 (65 FE) MG PO TABS
325.0000 mg | ORAL_TABLET | Freq: Two times a day (BID) | ORAL | Status: DC
  Administered 2014-05-01 – 2014-05-02 (×3): 325 mg via ORAL
  Filled 2014-05-01 (×3): qty 1

## 2014-05-01 MED ORDER — ZOLPIDEM TARTRATE 5 MG PO TABS: 5.0000 mg | ORAL_TABLET | Freq: Every evening | ORAL | Status: DC | PRN

## 2014-05-01 MED ORDER — SIMETHICONE 80 MG PO CHEW: 80.0000 mg | CHEWABLE_TABLET | ORAL | Status: DC | PRN

## 2014-05-01 MED ORDER — PRENATAL MULTIVITAMIN CH
1.0000 | ORAL_TABLET | Freq: Every day | ORAL | Status: DC
  Administered 2014-05-01: 1 via ORAL
  Filled 2014-05-01: qty 1

## 2014-05-01 NOTE — Progress Notes (Signed)
Patient was seen taking a medication (blue pill), patient confirmed she was taking her daily prescribed medication valtrex.  This med is not ordered under her MAR.

## 2014-05-01 NOTE — Anesthesia Postprocedure Evaluation (Signed)
  Anesthesia Post-op Note  Patient: [name] [name]  Procedure(s) Performed: * No procedures listed *  Patient Location: Mother/Baby  Anesthesia Type:Epidural  Level of Consciousness: awake, alert , oriented and patient cooperative  Airway and Oxygen Therapy: Patient Spontanous Breathing  Post-op Pain: none  Post-op Assessment: Post-op Vital signs reviewed, Patient's Cardiovascular Status Stable, Respiratory Function Stable, Patent Airway, No headache, No backache, No residual numbness and No residual motor weakness  Post-op Vital Signs: Reviewed and stable  Last Vitals:  Filed Vitals:   05/01/14 0555  BP: 115/62  Pulse: 83  Temp: 36.8 C  Resp: 20    Complications: No apparent anesthesia complications

## 2014-05-01 NOTE — Progress Notes (Signed)
Patient ID: [name] [name], female   DOB: [birth year], 34 y.o.   MRN: [id number] Postpartum day one Vital signs normal Fundus firm Lochia moderate Doing well

## 2014-05-02 NOTE — Progress Notes (Signed)
UR chart review completed.  

## 2014-05-02 NOTE — Progress Notes (Signed)
Patient ID: [name] [name], female   DOB: 1980/05/26, 34 y.o.   MRN: [id number] Postpartum day 2 Vital signs normal Fundus firm Lochia moderate Legs negative doing well home today

## 2014-05-02 NOTE — Discharge Summary (Signed)
Obstetric Discharge Summary Reason for Admission: onset of labor Prenatal Procedures: none Intrapartum Procedures: spontaneous vaginal delivery Postpartum Procedures: none Complications-Operative and Postpartum: none HEMOGLOBIN  Date Value Ref Range Status  05/01/2014 10.4* 12.0 - 15.0 g/dL Final   HCT  Date Value Ref Range Status  05/01/2014 31.6* 36.0 - 46.0 % Final    Physical Exam:  General: alert Lochia: appropriate Uterine Fundus: firm Incision: healing well DVT Evaluation: No evidence of DVT seen on physical exam.  Discharge Diagnoses: Term Pregnancy-delivered  Discharge Information: Date: 05/02/2014 Activity: pelvic rest Diet: routine Medications: Percocet Condition: improved Instructions: refer to practice specific booklet Discharge to: home Follow-up Information    Follow up with [name] CosierMARSHALL,[name], MD.   Specialty:  Obstetrics and Gynecology   Contact information:   [address] [place] [id number] [phone]       Newborn Data: Live born female  Birth Weight: 8 lb 7.1 oz (3830 g) APGAR: 9, 9  Home with mother.  [name] [name] [name] 05/02/2014, 6:42 AM

## 2014-05-02 NOTE — Discharge Instructions (Signed)
Discharge instructions   You can wash your hair  Shower  Eat what you want  Drink what you want  See me in 6 weeks  Your ankles are going to swell more in the next 2 weeks than when pregnant  No sex for 6 weeks   Leighann Amadon A, MD 05/02/2014

## 2014-05-09 ENCOUNTER — Other Ambulatory Visit: Payer: Self-pay | Admitting: Obstetrics

## 2014-05-20 ENCOUNTER — Encounter (HOSPITAL_COMMUNITY)
Admission: RE | Admit: 2014-05-20 | Discharge: 2014-05-20 | Disposition: A | Discharge status: Home / Self Care | Payer: Medicaid Other | Source: Ambulatory Visit | Attending: Obstetrics | Admitting: Obstetrics

## 2014-05-20 ENCOUNTER — Encounter (HOSPITAL_COMMUNITY): Payer: Self-pay

## 2014-05-20 DIAGNOSIS — Z01812 Encounter for preprocedural laboratory examination: Secondary | ICD-10-CM | POA: Diagnosis not present

## 2014-05-20 LAB — CBC
HEMATOCRIT: 43 % (ref 36.0–46.0)
HEMOGLOBIN: 13.9 g/dL (ref 12.0–15.0)
MCH: 30.5 pg (ref 26.0–34.0)
MCHC: 32.3 g/dL (ref 30.0–36.0)
MCV: 94.3 fL (ref 78.0–100.0)
Platelets: 522 10*3/uL — ABNORMAL HIGH (ref 150–400)
RBC: 4.56 MIL/uL (ref 3.87–5.11)
RDW: 13.9 % (ref 11.5–15.5)
WBC: 5.3 10*3/uL (ref 4.0–10.5)

## 2014-05-20 NOTE — Patient Instructions (Signed)
   Your procedure is scheduled on: MARCH 30 AT 730AM  Enter through the Main Entr[facility] of [facility] at:  Pick up the phone at the desk and dial [phone] and inform us of your arrival.  Please call this number if you have any problems the morning of surgery: [phone]  Remember: Do not eat food after midnight:MARCH 29 Do not drink clear liquids after: [id number] Take these medicines the morning of surgery with a SIP OF WATER:  Do not wear jewelry, make-up, or FINGER nail polish No metal in your hair or on your body. Do not wear lotions, powders, perfumes.  You may wear deodorant.  Do not bring valuables to the hospital. Contacts, dentures or bridgework may not be worn into surgery.  Leave suitcase in the car. After Surgery it may be brought to your room. For patients being admitted to the hospital, checkout time is 11:00am the day of discharge.    Patients discharged on the day of surgery will not be allowed to drive home.

## 2014-05-27 NOTE — H&P (Signed)
NAME:  [name], [name]                    ACCOUNT NO.:  MEDICAL RECORD NO.:  LOCATION:                                 FACILITY:  PHYSICIAN:  Kathreen CosierBernard A. Emmalyne Giacomo, M.D.   DATE OF BIRTH:  DATE OF ADMISSION: DATE OF DISCHARGE:                             HISTORY & PHYSICAL   HISTORY OF PRESENT ILLNESS:  The patient is a 34 year old gravida 7, para 2-2-3-5, who desires sterilization.  She understands the procedure can fail resulting in pregnancy in the tube or uterus.  PAST MEDICAL HISTORY:  She has a history of herpes and takes Valtrex from time to time.  PAST SURGICAL HISTORY:  Negative.  SOCIAL HISTORY:  Negative.  SYSTEM REVIEW:  Noncontributory.  PHYSICAL EXAMINATION:  GENERAL:  Well-developed female, in no distress. HEENT:  Negative. LUNGS:  Clear to P and A. HEART:  Regular rhythm.  No murmurs, no gallops. BREASTS:  Negative. ABDOMEN:  Not distended, nontender.  No masses. PELVIC:  Uterus top normal size.  Negative adnexa.  Vagina, external genitalia negative. EXTREMITIES:  Negative.          ______________________________ Kathreen CosierBernard A. [name] [name], M.D.     BAM/MEDQ  D:  05/27/2014  T:  05/27/2014  Job:  [id number]

## 2014-05-29 ENCOUNTER — Encounter (HOSPITAL_COMMUNITY)
Admission: RE | Disposition: A | Discharge status: Home / Self Care | Payer: Self-pay | Source: Ambulatory Visit | Attending: Obstetrics

## 2014-05-29 ENCOUNTER — Ambulatory Visit (HOSPITAL_COMMUNITY): Payer: Commercial Managed Care - PPO | Admitting: Anesthesiology

## 2014-05-29 ENCOUNTER — Encounter (HOSPITAL_COMMUNITY): Payer: Self-pay | Admitting: Anesthesiology

## 2014-05-29 ENCOUNTER — Ambulatory Visit (HOSPITAL_COMMUNITY)
Admission: RE | Admit: 2014-05-29 | Discharge: 2014-05-29 | Disposition: A | Discharge status: Home / Self Care | Payer: Commercial Managed Care - PPO | Source: Ambulatory Visit | Attending: Obstetrics | Admitting: Obstetrics

## 2014-05-29 DIAGNOSIS — Z302 Encounter for sterilization: Secondary | ICD-10-CM | POA: Diagnosis not present

## 2014-05-29 DIAGNOSIS — F329 Major depressive disorder, single episode, unspecified: Secondary | ICD-10-CM | POA: Insufficient documentation

## 2014-05-29 HISTORY — PX: LAPAROSCOPIC TUBAL LIGATION: SHX1937

## 2014-05-29 LAB — PREGNANCY, URINE: Preg Test, Ur: NEGATIVE

## 2014-05-29 SURGERY — LIGATION, FALLOPIAN TUBE, LAPAROSCOPIC
Anesthesia: General | Site: Abdomen | Laterality: Bilateral

## 2014-05-29 MED ORDER — FENTANYL CITRATE 0.05 MG/ML IJ SOLN
INTRAMUSCULAR | Status: AC
  Filled 2014-05-29: qty 2

## 2014-05-29 MED ORDER — ONDANSETRON HCL 4 MG/2ML IJ SOLN
INTRAMUSCULAR | Status: AC
  Filled 2014-05-29: qty 2

## 2014-05-29 MED ORDER — ONDANSETRON HCL 4 MG/2ML IJ SOLN
INTRAMUSCULAR | Status: DC | PRN
  Administered 2014-05-29: 4 mg via INTRAVENOUS

## 2014-05-29 MED ORDER — OXYCODONE HCL 5 MG/5ML PO SOLN: 5.0000 mg | Freq: Once | ORAL | Status: DC | PRN

## 2014-05-29 MED ORDER — SCOPOLAMINE 1 MG/3DAYS TD PT72
MEDICATED_PATCH | TRANSDERMAL | Status: AC
  Filled 2014-05-29: qty 1

## 2014-05-29 MED ORDER — LIDOCAINE HCL (CARDIAC) 20 MG/ML IV SOLN
INTRAVENOUS | Status: DC | PRN
  Administered 2014-05-29: 30 mg via INTRAVENOUS

## 2014-05-29 MED ORDER — OXYCODONE-ACETAMINOPHEN 5-325 MG PO TABS
ORAL_TABLET | ORAL | Status: AC
  Filled 2014-05-29: qty 1

## 2014-05-29 MED ORDER — LIDOCAINE HCL (CARDIAC) 20 MG/ML IV SOLN
INTRAVENOUS | Status: AC
  Filled 2014-05-29: qty 5

## 2014-05-29 MED ORDER — METOCLOPRAMIDE HCL 5 MG/ML IJ SOLN: 10.0000 mg | Freq: Once | INTRAMUSCULAR | Status: DC | PRN

## 2014-05-29 MED ORDER — ROCURONIUM BROMIDE 100 MG/10ML IV SOLN
INTRAVENOUS | Status: DC | PRN
  Administered 2014-05-29: 5 mg via INTRAVENOUS
  Administered 2014-05-29: 3 mg via INTRAVENOUS

## 2014-05-29 MED ORDER — KETOROLAC TROMETHAMINE 30 MG/ML IJ SOLN
INTRAMUSCULAR | Status: DC | PRN
  Administered 2014-05-29: 30 mg via INTRAVENOUS

## 2014-05-29 MED ORDER — ROCURONIUM BROMIDE 100 MG/10ML IV SOLN
INTRAVENOUS | Status: AC
  Filled 2014-05-29: qty 1

## 2014-05-29 MED ORDER — SUCCINYLCHOLINE CHLORIDE 20 MG/ML IJ SOLN
INTRAMUSCULAR | Status: AC
  Filled 2014-05-29: qty 1

## 2014-05-29 MED ORDER — FLUMAZENIL 0.5 MG/5ML IV SOLN
INTRAVENOUS | Status: DC | PRN
  Administered 2014-05-29: 0.2 mg via INTRAVENOUS

## 2014-05-29 MED ORDER — LACTATED RINGERS IV SOLN
INTRAVENOUS | Status: DC
  Administered 2014-05-29: 10 mL/h via INTRAVENOUS

## 2014-05-29 MED ORDER — PROPOFOL 10 MG/ML IV BOLUS
INTRAVENOUS | Status: DC | PRN
  Administered 2014-05-29: 180 mg via INTRAVENOUS

## 2014-05-29 MED ORDER — FENTANYL CITRATE 0.05 MG/ML IJ SOLN: 25.0000 ug | INTRAMUSCULAR | Status: DC | PRN

## 2014-05-29 MED ORDER — MIDAZOLAM HCL 2 MG/2ML IJ SOLN
INTRAMUSCULAR | Status: DC | PRN
  Administered 2014-05-29: 1 mg via INTRAVENOUS

## 2014-05-29 MED ORDER — KETOROLAC TROMETHAMINE 30 MG/ML IJ SOLN
INTRAMUSCULAR | Status: AC
  Filled 2014-05-29: qty 1

## 2014-05-29 MED ORDER — OXYCODONE-ACETAMINOPHEN 5-325 MG PO TABS
1.0000 | ORAL_TABLET | Freq: Once | ORAL | Status: AC
  Administered 2014-05-29: 1 via ORAL

## 2014-05-29 MED ORDER — MIDAZOLAM HCL 2 MG/2ML IJ SOLN
INTRAMUSCULAR | Status: AC
  Filled 2014-05-29: qty 2

## 2014-05-29 MED ORDER — DEXAMETHASONE SODIUM PHOSPHATE 10 MG/ML IJ SOLN
INTRAMUSCULAR | Status: DC | PRN
  Administered 2014-05-29: 4 mg via INTRAVENOUS

## 2014-05-29 MED ORDER — SCOPOLAMINE 1 MG/3DAYS TD PT72
1.0000 | MEDICATED_PATCH | Freq: Once | TRANSDERMAL | Status: DC
  Administered 2014-05-29: 1.5 mg via TRANSDERMAL

## 2014-05-29 MED ORDER — SUCCINYLCHOLINE CHLORIDE 20 MG/ML IJ SOLN
INTRAMUSCULAR | Status: DC | PRN
  Administered 2014-05-29: 20 mg via INTRAVENOUS
  Administered 2014-05-29: 40 mg via INTRAVENOUS
  Administered 2014-05-29: 120 mg via INTRAVENOUS

## 2014-05-29 MED ORDER — FLUMAZENIL 0.5 MG/5ML IV SOLN
INTRAVENOUS | Status: AC
  Filled 2014-05-29: qty 5

## 2014-05-29 MED ORDER — OXYCODONE HCL 5 MG PO TABS: 5.0000 mg | ORAL_TABLET | Freq: Once | ORAL | Status: DC | PRN

## 2014-05-29 MED ORDER — PROPOFOL 10 MG/ML IV BOLUS
INTRAVENOUS | Status: AC
  Filled 2014-05-29: qty 20

## 2014-05-29 MED ORDER — MEPERIDINE HCL 25 MG/ML IJ SOLN: 6.2500 mg | INTRAMUSCULAR | Status: DC | PRN

## 2014-05-29 MED ORDER — FENTANYL CITRATE 0.05 MG/ML IJ SOLN
INTRAMUSCULAR | Status: DC | PRN
  Administered 2014-05-29 (×2): 50 ug via INTRAVENOUS

## 2014-05-29 MED ORDER — DEXAMETHASONE SODIUM PHOSPHATE 4 MG/ML IJ SOLN
INTRAMUSCULAR | Status: AC
  Filled 2014-05-29: qty 1

## 2014-05-29 SURGICAL SUPPLY — 15 items
CATH ROBINSON RED A/P 16FR (CATHETERS) ×3 IMPLANT
CLOTH BEACON ORANGE TIMEOUT ST (SAFETY) ×3 IMPLANT
DRSG COVADERM PLUS 2X2 (GAUZE/BANDAGES/DRESSINGS) IMPLANT
DRSG OPSITE POSTOP 3X4 (GAUZE/BANDAGES/DRESSINGS) ×3 IMPLANT
GLOVE BIO SURGEON STRL SZ8.5 (GLOVE) ×6 IMPLANT
GOWN STRL REUS W/TWL 2XL LVL3 (GOWN DISPOSABLE) ×3 IMPLANT
GOWN STRL REUS W/TWL LRG LVL3 (GOWN DISPOSABLE) ×3 IMPLANT
LIQUID BAND (GAUZE/BANDAGES/DRESSINGS) ×3 IMPLANT
PACK LAPAROSCOPY BASIN (CUSTOM PROCEDURE TRAY) ×3 IMPLANT
PAD POSITIONER PINK NONSTERILE (MISCELLANEOUS) ×3 IMPLANT
SUT MON AB 4-0 PS1 27 (SUTURE) ×3 IMPLANT
SUT VIC AB 0 CT1 27 (SUTURE) ×2
SUT VIC AB 0 CT1 27XBRD ANBCTR (SUTURE) ×1 IMPLANT
TOWEL OR 17X24 6PK STRL BLUE (TOWEL DISPOSABLE) ×6 IMPLANT
WATER STERILE IRR 1000ML POUR (IV SOLUTION) ×3 IMPLANT

## 2014-05-29 NOTE — Discharge Instructions (Signed)
Laparoscopy for Tubal Ligation Care After Laparoscopic tubal ligation is an operation done with a long, lighted tube inserted through a small cut (incision) in the abdomen. The fallopian tubes are blocked by tying, clamping with a plastic clamp, or burning them closed with an electrocautery. Read the instructions outlined below and refer to this sheet in the next few weeks. These instructions provide you with general information on caring for yourself after you leave the hospital. Your caregiver may also give you specific instructions. While your treatment has been planned according to the most current medical practices available, unavoidable complications may occur. If you have any problems or questions after discharge, please call your caregiver. HOME CARE INSTRUCTIONS  It will be normal to be sore for a couple days following surgery.   Take your medicine and follow the instructions from your caregiver.   You may resume usual diet, exercise, driving and activities as allowed by your caregiver.   Do not have sexual intercourse until your caregiver gives you permission.   Do not drive while taking pain medicine.   Avoid lifting until you are instructed otherwise.   Use showers for bathing, until you are seen by your caregiver.   Change dressings if needed, and as directed.   Only take over-the-counter or prescription medicines for pain, discomfort or fever as directed by your caregiver.   Do not take aspirin because it can cause bleeding.   Take your temperature twice a day and record it.   Have someone stay with you the day you have the operation, and for a couple days afterward.   Make an appointment to see your caregiver for stitches (sutures) or staple removal and postoperative exams, as instructed.  SEEK MEDICAL CARE IF:  There is redness, swelling, or increasing pain in a wound.   There is drainage from a wound lasting longer than one day.   Your pain is getting worse.    You develop a rash.   You are having a reaction to your medicine.   You become dizzy or lightheaded.   You need stronger medicine or a change in your pain medicine.   You notice a foul smell coming from a wound or dressing.   There is a breaking open of a wound after the stitches, staples, or skin adhesive strips have been removed.   You develop constipation.  SEEK IMMEDIATE MEDICAL CARE IF:  You have an oral temperature above 100.4, not controlled by medicine.   There is increasing abdominal pain.   You develop pain in your shoulders (shoulder strap areas) which becomes more severe. Some pain is common, because of the gas inserted into your abdomen during the procedure.   You develop bleeding or drainage from the suture sites or vagina (birth canal) following surgery.   You pass out.   You develop shortness of breath or difficulty breathing.   You develop chest or leg pain.   You develop persistent nausea, vomiting or diarrhea.  MAKE SURE YOU:   Understand these instructions.   Watch your condition.   Get help right away if you are not doing well or get worse.  Document Released: 09/04/2004 Document Re-Released: 08/05/2009 [facility] Patient Information 2011 [place].   Post Anesthesia Home Care Instructions  Activity: Get plenty of rest for the remainder of the day. A responsible adult should stay with you for 24 hours following the procedure.  For the next 24 hours, DO NOT: -Drive a car -Advertising copywriter -Drink alcoholic beverages -Take  any medication unless instructed by your physician -Make any legal decisions or sign important papers.  Meals: Start with liquid foods such as gelatin or soup. Progress to regular foods as tolerated. Avoid greasy, spicy, heavy foods. If nausea and/or vomiting occur, drink only clear liquids until the nausea and/or vomiting subsides. Call your physician if vomiting continues.  Special Instructions/Symptoms: Your  throat may feel dry or sore from the anesthesia or the breathing tube placed in your throat during surgery. If this causes discomfort, gargle with warm salt water. The discomfort should disappear within 24 hours.  If you had a scopolamine patch placed behind your ear for the management of post- operative nausea and/or vomiting:  1. The medication in the patch is effective for 72 hours, after which it should be removed.  Wrap patch in a tissue and discard in the trash. Wash hands thoroughly with soap and water. 2. You may remove the patch earlier than 72 hours if you experience unpleasant side effects which may include dry mouth, dizziness or visual disturbances. 3. Avoid touching the patch. Wash your hands with soap and water after contact with the patch.

## 2014-05-29 NOTE — H&P (Signed)
  There has been no change in her history and physical since the original dictation 

## 2014-05-29 NOTE — Anesthesia Procedure Notes (Signed)
Procedure Name: Intubation Date/Time: 05/29/2014 7:25 AM Performed by: Suella GroveMOORE, Louanna Vanliew C Pre-anesthesia Checklist: Patient identified, Patient being monitored, Timeout performed, Emergency Drugs available and Suction available Patient Re-evaluated:Patient Re-evaluated prior to inductionOxygen Delivery Method: Circle system utilized and Simple face mask Preoxygenation: Pre-oxygenation with 100% oxygen Intubation Type: IV induction Ventilation: Mask ventilation without difficulty Laryngoscope Size: Mac and 3 Grade View: Grade II Tube type: Oral Tube size: 7.0 mm Number of attempts: 1 Placement Confirmation: ETT inserted through vocal cords under direct vision,  positive ETCO2 and breath sounds checked- equal and bilateral Secured at: 22 (com) cm Dental Injury: Teeth and Oropharynx as per pre-operative assessment

## 2014-05-29 NOTE — Anesthesia Preprocedure Evaluation (Addendum)
Anesthesia Evaluation  Patient identified by MRN, date of birth, ID band Patient awake    Reviewed: Allergy & Precautions, NPO status , Patient's Chart, lab work & pertinent test results  Airway Mallampati: II  TM Distance: >3 FB Neck ROM: Full    Dental no notable dental hx. (+) Teeth Intact   Pulmonary neg pulmonary ROS,  breath sounds clear to auscultation  Pulmonary exam normal       Cardiovascular negative cardio ROS  Rhythm:Regular Rate:Normal     Neuro/Psych  Headaches, PSYCHIATRIC DISORDERS Depression    GI/Hepatic negative GI ROS, Neg liver ROS,   Endo/Other  negative endocrine ROS  Renal/GU negative Renal ROS  negative genitourinary   Musculoskeletal negative musculoskeletal ROS (+)   Abdominal   Peds  Hematology negative hematology ROS (+)   Anesthesia Other Findings   Reproductive/Obstetrics HSV Desires Sterilization                            Anesthesia Physical Anesthesia Plan  ASA: II  Anesthesia Plan: General   Post-op Pain Management:    Induction: Intravenous  Airway Management Planned: Oral ETT  Additional Equipment:   Intra-op Plan:   Post-operative Plan: Extubation in OR  Informed Consent: I have reviewed the patients History and Physical, chart, labs and discussed the procedure including the risks, benefits and alternatives for the proposed anesthesia with the patient or authorized representative who has indicated his/her understanding and acceptance.   Dental advisory given  Plan Discussed with: Anesthesiologist, CRNA and Surgeon  Anesthesia Plan Comments:         Anesthesia Quick Evaluation

## 2014-05-29 NOTE — Transfer of Care (Signed)
Immediate Anesthesia Transfer of Care Note  Patient: [name] [name]  Procedure(s) Performed: Procedure(s): LAPAROSCOPIC TUBAL LIGATION (Bilateral)  Patient Location: PACU  Anesthesia Type:General  Level of Consciousness: awake, sedated and patient cooperative  Airway & Oxygen Therapy: Patient Spontanous Breathing and Patient connected to nasal cannula oxygen  Post-op Assessment: Report given to RN and Post -op Vital signs reviewed and stable  Post vital signs: Reviewed and stable  Last Vitals:  Filed Vitals:   05/29/14 0650  BP: 110/83  Pulse: 78  Temp: 36.5 C  Resp: 20    Complications: No apparent anesthesia complications

## 2014-05-29 NOTE — Anesthesia Postprocedure Evaluation (Signed)
  Anesthesia Post-op Note  Patient: [name] [name]  Procedure(s) Performed: Procedure(s): LAPAROSCOPIC TUBAL LIGATION (Bilateral)  Patient Location: PACU  Anesthesia Type:General  Level of Consciousness: awake, alert  and oriented  Airway and Oxygen Therapy: Patient Spontanous Breathing  Post-op Pain: mild  Post-op Assessment: Post-op Vital signs reviewed, Patient's Cardiovascular Status Stable, Respiratory Function Stable, Patent Airway, No signs of Nausea or vomiting, Adequate PO intake and Pain level controlled  Post-op Vital Signs: Reviewed and stable  Last Vitals:  Filed Vitals:   05/29/14 0900  BP: 119/87  Pulse: 56  Temp:   Resp: 19    Complications: No apparent anesthesia complications

## 2014-05-29 NOTE — Op Note (Signed)
Preop diagnosis multiparity Postop diagnosis old laparoscopic tubal sterilization Anesthesia general Surgeon Dr. Francoise CeoBernard [name] [name] Procedure on the general anesthesia patient in the lithotomy position abdomen perineum and vagina prepped and draped bladder emptied with a straight catheter speculum placed in the vagina and the cervix grasped with a Hulka tenaculum in the umbilicus a transverse incision made carried down to the fascia fascia cleaned grasped to cochleas and the fascia and peritoneum all within Mayo scissors the sleeve of the trocar inserted intraperitoneally and 3 L carbon dioxide infuse intraperitoneally  Visualizing  scope inserted uterus tubes and ovaries normal cautery probe inserted through the sleeve of the scope the right tube grasped 1 inch from the  Cornu  Cauterized the tube was cauterized a total of 4 places moving laterally from the first site of cautery the procedure done in a similar fashion on the other side the  Probes  removed CO2 allowed to escape from the peritoneal cavity fascia closed with one stitch of 0 Dexon and the skin closed with subcuticular stitch of 4-0 Monocryl patient tolerated the procedure well

## 2014-07-07 ENCOUNTER — Emergency Department (HOSPITAL_COMMUNITY)
Admission: EM | Admit: 2014-07-07 | Discharge: 2014-07-08 | Disposition: A | Discharge status: Home / Self Care | Payer: Commercial Managed Care - PPO | Attending: Emergency Medicine | Admitting: Emergency Medicine

## 2014-07-07 ENCOUNTER — Encounter (HOSPITAL_COMMUNITY): Payer: Self-pay | Admitting: Emergency Medicine

## 2014-07-07 DIAGNOSIS — Z3202 Encounter for pregnancy test, result negative: Secondary | ICD-10-CM | POA: Insufficient documentation

## 2014-07-07 DIAGNOSIS — Z79899 Other long term (current) drug therapy: Secondary | ICD-10-CM | POA: Insufficient documentation

## 2014-07-07 DIAGNOSIS — X58XXXA Exposure to other specified factors, initial encounter: Secondary | ICD-10-CM | POA: Diagnosis not present

## 2014-07-07 DIAGNOSIS — Y999 Unspecified external cause status: Secondary | ICD-10-CM | POA: Diagnosis not present

## 2014-07-07 DIAGNOSIS — F53 Postpartum depression: Secondary | ICD-10-CM

## 2014-07-07 DIAGNOSIS — T7491XA Unspecified adult maltreatment, confirmed, initial encounter: Secondary | ICD-10-CM

## 2014-07-07 DIAGNOSIS — O99345 Other mental disorders complicating the puerperium: Secondary | ICD-10-CM

## 2014-07-07 DIAGNOSIS — Y939 Activity, unspecified: Secondary | ICD-10-CM | POA: Insufficient documentation

## 2014-07-07 DIAGNOSIS — Z8619 Personal history of other infectious and parasitic diseases: Secondary | ICD-10-CM | POA: Insufficient documentation

## 2014-07-07 DIAGNOSIS — Z8679 Personal history of other diseases of the circulatory system: Secondary | ICD-10-CM | POA: Insufficient documentation

## 2014-07-07 DIAGNOSIS — Y929 Unspecified place or not applicable: Secondary | ICD-10-CM | POA: Insufficient documentation

## 2014-07-07 DIAGNOSIS — T7411XA Adult physical abuse, confirmed, initial encounter: Secondary | ICD-10-CM | POA: Diagnosis not present

## 2014-07-07 DIAGNOSIS — F329 Major depressive disorder, single episode, unspecified: Secondary | ICD-10-CM | POA: Diagnosis present

## 2014-07-07 LAB — CBC WITH DIFFERENTIAL/PLATELET
Basophils Absolute: 0 10*3/uL (ref 0.0–0.1)
Basophils Relative: 0 % (ref 0–1)
Eosinophils Absolute: 0.2 10*3/uL (ref 0.0–0.7)
Eosinophils Relative: 4 % (ref 0–5)
HCT: 41 % (ref 36.0–46.0)
Hemoglobin: 13.1 g/dL (ref 12.0–15.0)
LYMPHS ABS: 2.3 10*3/uL (ref 0.7–4.0)
Lymphocytes Relative: 49 % — ABNORMAL HIGH (ref 12–46)
MCH: 29.8 pg (ref 26.0–34.0)
MCHC: 32 g/dL (ref 30.0–36.0)
MCV: 93.2 fL (ref 78.0–100.0)
MONOS PCT: 8 % (ref 3–12)
Monocytes Absolute: 0.4 10*3/uL (ref 0.1–1.0)
NEUTROS ABS: 1.8 10*3/uL (ref 1.7–7.7)
NEUTROS PCT: 39 % — AB (ref 43–77)
PLATELETS: 291 10*3/uL (ref 150–400)
RBC: 4.4 MIL/uL (ref 3.87–5.11)
RDW: 13.6 % (ref 11.5–15.5)
WBC: 4.7 10*3/uL (ref 4.0–10.5)

## 2014-07-07 LAB — ETHANOL: Alcohol, Ethyl (B): <5 mg/dL (ref <5)

## 2014-07-07 LAB — COMPREHENSIVE METABOLIC PANEL
ALBUMIN: 3.7 g/dL (ref 3.5–5.0)
ALK PHOS: 55 U/L (ref 38–126)
ALT: 17 U/L (ref 14–54)
AST: 18 U/L (ref 15–41)
Anion gap: 7 (ref 5–15)
BILIRUBIN TOTAL: 0.6 mg/dL (ref 0.3–1.2)
BUN: 9 mg/dL (ref 6–20)
CHLORIDE: 103 mmol/L (ref 101–111)
CO2: 27 mmol/L (ref 22–32)
Calcium: 9.1 mg/dL (ref 8.9–10.3)
Creatinine, Ser: 0.78 mg/dL (ref 0.44–1.00)
GFR calc Af Amer: >60 mL/min (ref >60)
Glucose, Bld: 96 mg/dL (ref 70–99)
Potassium: 4.1 mmol/L (ref 3.5–5.1)
SODIUM: 137 mmol/L (ref 135–145)
Total Protein: 7.1 g/dL (ref 6.5–8.1)

## 2014-07-07 LAB — RAPID URINE DRUG SCREEN, HOSP PERFORMED
AMPHETAMINES: NOT DETECTED
Barbiturates: NOT DETECTED
Benzodiazepines: NOT DETECTED
Cocaine: NOT DETECTED
OPIATES: NOT DETECTED
Tetrahydrocannabinol: NOT DETECTED

## 2014-07-07 LAB — POC URINE PREG, ED: Preg Test, Ur: NEGATIVE
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
## 2014-07-07 NOTE — ED Notes (Signed)
Pt. reports multiple stresses / emotional strain with mild dizziness onset this week , denies suicidal ideation / no hallucinations .

## 2014-07-08 DIAGNOSIS — F53 Puerperal psychosis: Secondary | ICD-10-CM | POA: Diagnosis not present
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
## 2015-05-13 ENCOUNTER — Telehealth: Payer: Self-pay

## 2015-05-13 NOTE — Telephone Encounter (Signed)
Left message for patient to call regarding newly diagnosed HIV.   [name] Josephsammy K King, RN

## 2015-06-24 ENCOUNTER — Telehealth: Payer: Self-pay

## 2015-06-24 NOTE — Telephone Encounter (Signed)
Message left on voicemail. Referral received for hx of HIV without treatment form primary care physician. She will need to schedule an intake visit.   Laurell Josephsammy K King, RN

## 2015-11-28 LAB — PROCEDURE REPORT - SCANNED: Pap: NEGATIVE

## 2016-07-14 ENCOUNTER — Telehealth: Payer: Self-pay

## 2016-07-14 NOTE — Telephone Encounter (Signed)
New patient referral from Dr Nehemiah SettlePolite.  Previous positive HIV .  Left message on machine to call .   Laurell Josephsammy K King, RN

## 2016-08-09 ENCOUNTER — Ambulatory Visit: Payer: Self-pay | Admitting: Internal Medicine

## 2016-08-12 ENCOUNTER — Ambulatory Visit: Payer: Self-pay | Admitting: Internal Medicine

## 2016-09-20 ENCOUNTER — Encounter: Payer: Self-pay | Admitting: *Deleted

## 2016-09-20 DIAGNOSIS — R1084 Generalized abdominal pain: Secondary | ICD-10-CM | POA: Insufficient documentation

## 2016-09-20 DIAGNOSIS — R110 Nausea: Secondary | ICD-10-CM | POA: Diagnosis not present

## 2016-09-20 DIAGNOSIS — R51 Headache: Secondary | ICD-10-CM | POA: Diagnosis not present

## 2016-09-20 DIAGNOSIS — Z79899 Other long term (current) drug therapy: Secondary | ICD-10-CM | POA: Diagnosis not present

## 2016-09-20 LAB — COMPREHENSIVE METABOLIC PANEL
ALT: 12 U/L — ABNORMAL LOW (ref 14–54)
ANION GAP: 6 (ref 5–15)
AST: 15 U/L (ref 15–41)
Albumin: 4 g/dL (ref 3.5–5.0)
Alkaline Phosphatase: 42 U/L (ref 38–126)
BUN: 13 mg/dL (ref 6–20)
CALCIUM: 9 mg/dL (ref 8.9–10.3)
CO2: 27 mmol/L (ref 22–32)
Chloride: 105 mmol/L (ref 101–111)
Creatinine, Ser: 0.59 mg/dL (ref 0.44–1.00)
GFR calc Af Amer: >60 mL/min (ref >60)
GFR calc non Af Amer: >60 mL/min (ref >60)
Glucose, Bld: 115 mg/dL — ABNORMAL HIGH (ref 65–99)
Potassium: 3.5 mmol/L (ref 3.5–5.1)
Sodium: 138 mmol/L (ref 135–145)
TOTAL PROTEIN: 7.5 g/dL (ref 6.5–8.1)
Total Bilirubin: 0.6 mg/dL (ref 0.3–1.2)

## 2016-09-20 LAB — URINALYSIS, COMPLETE (UACMP) WITH MICROSCOPIC
Bilirubin Urine: NEGATIVE
GLUCOSE, UA: NEGATIVE mg/dL
Hgb urine dipstick: NEGATIVE
Ketones, ur: NEGATIVE mg/dL
Leukocytes, UA: NEGATIVE
NITRITE: NEGATIVE
Protein, ur: NEGATIVE mg/dL
RBC / HPF: NONE SEEN RBC/hpf (ref 0–5)
SPECIFIC GRAVITY, URINE: 1.024 (ref 1.005–1.030)
pH: 7 (ref 5.0–8.0)

## 2016-09-20 LAB — CBC
HCT: 34.2 % — ABNORMAL LOW (ref 35.0–47.0)
HEMOGLOBIN: 11.5 g/dL — AB (ref 12.0–16.0)
MCH: 29.1 pg (ref 26.0–34.0)
MCHC: 33.6 g/dL (ref 32.0–36.0)
MCV: 86.4 fL (ref 80.0–100.0)
Platelets: 294 10*3/uL (ref 150–440)
RBC: 3.96 MIL/uL (ref 3.80–5.20)
RDW: 13.7 % (ref 11.5–14.5)
WBC: 6.1 10*3/uL (ref 3.6–11.0)

## 2016-09-20 LAB — LIPASE, BLOOD: Lipase: 26 U/L (ref 11–51)

## 2016-09-20 LAB — POCT PREGNANCY, URINE: Preg Test, Ur: NEGATIVE

## 2016-09-20 NOTE — ED Triage Notes (Signed)
Pt has abd pain after eating a bagel at 1600 today.  No v/d.  Pt has nausea.  Pt alert.

## 2016-09-21 ENCOUNTER — Emergency Department
Admission: EM | Admit: 2016-09-21 | Discharge: 2016-09-21 | Disposition: A | Discharge status: Home / Self Care | Payer: Commercial Managed Care - PPO | Attending: Emergency Medicine | Admitting: Emergency Medicine

## 2016-09-21 DIAGNOSIS — R519 Headache, unspecified: Secondary | ICD-10-CM

## 2016-09-21 DIAGNOSIS — R51 Headache: Secondary | ICD-10-CM

## 2016-09-21 DIAGNOSIS — R1084 Generalized abdominal pain: Secondary | ICD-10-CM

## 2016-09-21 DIAGNOSIS — R110 Nausea: Secondary | ICD-10-CM

## 2016-09-21 MED ORDER — ONDANSETRON 4 MG PO TBDP
4.0000 mg | ORAL_TABLET | Freq: Once | ORAL | Status: AC
  Administered 2016-09-21: 4 mg via ORAL
  Filled 2016-09-21: qty 1

## 2016-09-21 MED ORDER — ONDANSETRON HCL 4 MG/2ML IJ SOLN
4.0000 mg | Freq: Once | INTRAMUSCULAR | Status: DC
  Filled 2016-09-21: qty 2

## 2016-09-21 MED ORDER — BUTALBITAL-APAP-CAFFEINE 50-325-40 MG PO TABS
2.0000 | ORAL_TABLET | Freq: Once | ORAL | Status: AC
  Administered 2016-09-21: 2 via ORAL
  Filled 2016-09-21: qty 2

## 2016-09-21 NOTE — ED Notes (Signed)
Pt left without taking her discharge instruction.

## 2016-09-21 NOTE — ED Provider Notes (Signed)
[facility] Emergency Department Provider Note   ____________________________________________   First MD Initiated Contact with Patient 09/21/16 0009     (approximate)  I have reviewed the triage vital signs and the nursing notes.   HISTORY  Chief Complaint Abdominal Pain    HPI [name] [name] is a 36 y.o. female who comes into the hospital today with some nausea. The patient ate a bagel around lunchtime and she reports that it was warm. She started feeling sick around 4:30. The patient did not vomit she just felt nauseous. The patient has had some mild abdominal pain but reports that she is now having a headache. She states that it started after she left her job. She didn't take anything for her pain noted she take anything for her nausea. She reports that the abdominal pain is improved but her headache is a 10 out of 10 in intensity. The patient does have a history of migraines and reports that this feels like her migraines. The patient has not had any chest pain or shortness of breath and she denies any diarrhea. The patient came in to the hospital to be checked out and to see if she needed to be taken out of work. The patient is here today for evaluation.   Past Medical History:  Diagnosis Date  . Depression    ok now  . Herpes    on valtrex  . HSV-2 infection   . Hx of migraines     Patient Active Problem List   Diagnosis Date Noted  . NVD (normal vaginal delivery) 05/01/2014  . Indication for care in labor or delivery 04/30/2014    Past Surgical History:  Procedure Laterality Date  . INDUCED ABORTION     3 elective  . LAPAROSCOPIC TUBAL LIGATION Bilateral 05/29/2014   Procedure: LAPAROSCOPIC TUBAL LIGATION;  Surgeon: Frederico Hamman, MD;  Location: [facility];  Service: Gynecology;  Laterality: Bilateral;  . NO PAST SURGERIES      Prior to Admission medications   Medication Sig Start Date End Date Taking? Authorizing Provider  Prenatal  Vit-Fe Fumarate-FA (PRENATAL MULTIVITAMIN) TABS tablet Take 1 tablet by mouth daily at 12 noon.    [provider]  valACYclovir (VALTREX) 500 MG tablet Take 500 mg by mouth daily.    [provider]    Allergies Patient has no known allergies.  Family History  Problem Relation Age of Onset  . Stroke Father   . Diabetes Brother   . Leukemia Maternal Aunt   . Pneumonia Maternal Grandmother   . Cancer Maternal Grandfather        lungs  . Diabetes Paternal Grandmother   . Diabetes Paternal Grandfather   . Diabetes Paternal Uncle     Social History Social History  Substance Use Topics  . Smoking status: Never Smoker  . Smokeless tobacco: Never Used  . Alcohol use No    Review of Systems  Constitutional: No fever/chills Eyes: No visual changes. ENT: No sore throat. Cardiovascular: Denies chest pain. Respiratory: Denies shortness of breath. Gastrointestinal:  abdominal pain, nausea, no vomiting.  No diarrhea.  No constipation. Genitourinary: Negative for dysuria. Musculoskeletal: Negative for back pain. Skin: Negative for rash. Neurological: Headache   ____________________________________________   PHYSICAL EXAM:  VITAL SIGNS: ED Triage Vitals  Enc Vitals Group     BP 09/20/16 2218 114/79     Pulse Rate 09/20/16 2218 68     Resp 09/20/16 2218 20  Temp 09/20/16 2218 97.8 F (36.6 C)     Temp Source 09/20/16 2218 Oral     SpO2 09/20/16 2218 100 %     Weight 09/20/16 2219 150 lb (68 kg)     Height 09/20/16 2219 _0  (1.702 m)     Head Circumference --      Peak Flow --      Pain Score 09/20/16 2218 9     Pain Loc --      Pain Edu? --      Excl. in [place]? --     Constitutional: Alert and oriented. Well appearing and in mild distress. Eyes: Conjunctivae are normal. PERRL. EOMI. Head: Atraumatic. Nose: No congestion/rhinnorhea. Mouth/Throat: Mucous membranes are moist.  Oropharynx non-erythematous. Cardiovascular: Normal rate, regular  rhythm. Grossly normal heart sounds.  Good peripheral circulation. Respiratory: Normal respiratory effort.  No retractions. Lungs CTAB. Gastrointestinal: Soft and nontender. No distention. Positive bowels sounds Musculoskeletal: No lower extremity tenderness nor edema.   Neurologic:  Normal speech and language.  Skin:  Skin is warm, dry and intact.  Psychiatric: Mood and affect are normal.   ____________________________________________   LABS (all labs ordered are listed, but only abnormal results are displayed)  Labs Reviewed  COMPREHENSIVE METABOLIC PANEL - Abnormal; Notable for the following:       Result Value   Glucose, Bld 115 (*)    ALT 12 (*)    All other components within normal limits  CBC - Abnormal; Notable for the following:    Hemoglobin 11.5 (*)    HCT 34.2 (*)    All other components within normal limits  URINALYSIS, COMPLETE (UACMP) WITH MICROSCOPIC - Abnormal; Notable for the following:    Color, Urine YELLOW (*)    APPearance CLEAR (*)    Bacteria, UA RARE (*)    Squamous Epithelial / LPF 0-5 (*)    All other components within normal limits  LIPASE, BLOOD  POC URINE PREG, ED  POCT PREGNANCY, URINE   ____________________________________________  EKG  none ____________________________________________  RADIOLOGY  No results found.  ____________________________________________   PROCEDURES  Procedure(s) performed: None  Procedures  Critical Care performed: No  ____________________________________________   INITIAL IMPRESSION / ASSESSMENT AND PLAN / ED COURSE  Pertinent labs & imaging results that were available during my care of the patient were reviewed by me and considered in my medical decision making (see chart for details).  This is a 36 year old who comes into the hospital today with some nausea and abdominal pain and headache. The patient had this nausea after eating a warm bagel earlier today. Right now her abdominal pain is  improved as is her nausea but she does have a headache. I will give HER-2 Fioricet and some Zofran. The patient's blood work is unremarkable. I will reassess the patient when she received her medication and she will be dispositioned.     The patient reports that she does feel better to the nurse. She reports though that she is unable to wait as her ride is leaving. The patient decided she wanted to leave but she left without waiting for her discharge paperwork. The patient reports she has an appointment at the end of the month with her primary care physician. ____________________________________________   FINAL CLINICAL IMPRESSION(S) / ED DIAGNOSES  Final diagnoses:  Generalized abdominal pain  Nausea  Acute nonintractable headache, unspecified headache type      NEW MEDICATIONS STARTED DURING THIS VISIT:  New Prescriptions   No medications on  file     Note:  This document was prepared using Dragon voice recognition software and may include unintentional dictation errors.    Loney Hering, MD 09/21/16 [phone]

## 2016-09-28 ENCOUNTER — Ambulatory Visit (INDEPENDENT_AMBULATORY_CARE_PROVIDER_SITE_OTHER): Payer: Commercial Managed Care - PPO | Admitting: Internal Medicine

## 2016-09-28 ENCOUNTER — Encounter: Payer: Self-pay | Admitting: Internal Medicine

## 2016-09-28 VITALS — BP 100/66 | HR 73 | Temp 98.3°F | Ht 67.0 in | Wt 151.0 lb

## 2016-09-28 DIAGNOSIS — B009 Herpesviral infection, unspecified: Secondary | ICD-10-CM | POA: Diagnosis not present
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
## 2016-09-28 NOTE — Patient Instructions (Signed)
Please have your employer give you FMLA paperwork to let them know that we will see you every 3 months for doctors visit

## 2016-09-29 LAB — HIV ANTIBODY (ROUTINE TESTING W REFLEX): HIV 1&2 Ab, 4th Generation: NONREACTIVE

## 2016-10-05 ENCOUNTER — Ambulatory Visit: Payer: Self-pay | Admitting: Internal Medicine

## 2016-11-16 ENCOUNTER — Telehealth: Payer: Self-pay | Admitting: *Deleted

## 2016-11-16 NOTE — Telephone Encounter (Signed)
Patient is requesting an appointment with Dr. Drue Second. Please advise if she should schedule with you or with the PREP clinic. Andree Coss, RN

## 2016-12-10 ENCOUNTER — Ambulatory Visit: Payer: Self-pay | Admitting: Family Medicine

## 2016-12-16 ENCOUNTER — Ambulatory Visit: Payer: Self-pay | Admitting: Family Medicine

## 2016-12-21 ENCOUNTER — Ambulatory Visit: Payer: Self-pay | Admitting: Family Medicine

## 2016-12-22 ENCOUNTER — Ambulatory Visit: Payer: Self-pay | Admitting: Family Medicine

## 2016-12-27 ENCOUNTER — Ambulatory Visit: Payer: Self-pay | Admitting: Family Medicine

## 2017-01-04 ENCOUNTER — Other Ambulatory Visit: Payer: Self-pay | Admitting: *Deleted

## 2017-01-04 MED ORDER — VALACYCLOVIR HCL 1 G PO TABS: 1000.0000 mg | ORAL_TABLET | Freq: Every day | ORAL | 5 refills | Status: DC

## 2017-02-16 ENCOUNTER — Telehealth: Payer: Self-pay | Admitting: *Deleted

## 2017-02-16 NOTE — Telephone Encounter (Signed)
Patient called asking if she needs to be rechecked or if she needs an appointment for prep. Last seen 09/17/16. [name] [name]

## 2017-02-17 NOTE — Telephone Encounter (Signed)
Can you see if stephanie or minh or cassie can see her?

## 2017-02-17 NOTE — Telephone Encounter (Signed)
Attempted to call patient and no answer and no voice mail. If she calls back will schedule with provider.

## 2018-02-20 ENCOUNTER — Other Ambulatory Visit: Payer: Self-pay | Admitting: Behavioral Health

## 2018-02-20 DIAGNOSIS — B009 Herpesviral infection, unspecified: Secondary | ICD-10-CM

## 2018-02-20 MED ORDER — VALACYCLOVIR HCL 1 G PO TABS: 1000.0000 mg | ORAL_TABLET | Freq: Every day | ORAL | 5 refills | Status: DC

## 2018-03-27 ENCOUNTER — Ambulatory Visit: Payer: Self-pay | Admitting: Internal Medicine

## 2018-04-10 ENCOUNTER — Other Ambulatory Visit: Payer: Self-pay

## 2018-04-10 ENCOUNTER — Telehealth: Payer: Self-pay | Admitting: *Deleted

## 2018-04-10 NOTE — Telephone Encounter (Signed)
-----   Message from Lucina MellowAileen J Cruz sent at 04/10/2018 11:05 AM EST ----- Patient updated phone number, has no insurance and wanted to speak to some one about financial assistance. Said she only has about 20 pills, hone number  [phone]

## 2018-04-12 NOTE — Telephone Encounter (Signed)
Patient is not being treated for HIV, but for herpes. She may need referred to [facility] for PrEP per Dr Drue Second, but will need to follow with PCP for HSV and medication assistance.  RN will cancel patient's appointment with Dr Drue Second 3/2.  Will relay instructions to [place].

## 2018-04-13 NOTE — Telephone Encounter (Signed)
Cancelled patient's upcoming appointment with Dr Drue Second.  Gave her phone number to [facility] to establish primary care for Valtrex/HSV management, relayed message to [facility] for PREP referral (patient is ok with PreP).

## 2018-05-01 ENCOUNTER — Encounter: Payer: Self-pay | Admitting: Internal Medicine

## 2018-05-08 ENCOUNTER — Other Ambulatory Visit: Payer: Medicaid Other

## 2018-05-08 ENCOUNTER — Other Ambulatory Visit: Payer: Self-pay

## 2018-05-19 ENCOUNTER — Ambulatory Visit: Payer: Self-pay | Attending: Internal Medicine | Admitting: Internal Medicine

## 2018-05-19 ENCOUNTER — Other Ambulatory Visit: Payer: Self-pay

## 2018-05-19 ENCOUNTER — Encounter: Payer: Self-pay | Admitting: Internal Medicine

## 2018-05-19 VITALS — BP 109/76 | HR 66 | Temp 98.1°F | Resp 16 | Ht 67.0 in | Wt 159.6 lb

## 2018-05-19 DIAGNOSIS — F4310 Post-traumatic stress disorder, unspecified: Secondary | ICD-10-CM

## 2018-05-19 DIAGNOSIS — Z8619 Personal history of other infectious and parasitic diseases: Secondary | ICD-10-CM

## 2018-05-19 DIAGNOSIS — B009 Herpesviral infection, unspecified: Secondary | ICD-10-CM

## 2018-05-19 DIAGNOSIS — F419 Anxiety disorder, unspecified: Secondary | ICD-10-CM

## 2018-05-19 MED ORDER — VALACYCLOVIR HCL 1 G PO TABS: 1000.0000 mg | ORAL_TABLET | Freq: Every day | ORAL | 5 refills | Status: DC
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
## 2018-05-22 ENCOUNTER — Telehealth: Payer: Self-pay

## 2018-05-22 NOTE — Telephone Encounter (Signed)
Contacted pt to go over lab results pt is aware and doesn't have any questions or concerns 

## 2018-05-23 ENCOUNTER — Encounter (INDEPENDENT_AMBULATORY_CARE_PROVIDER_SITE_OTHER): Payer: Self-pay | Admitting: Nurse Practitioner

## 2018-06-03 LAB — HEPATITIS B SURFACE ANTIBODY,QUALITATIVE: Hep B Surface Ab, Qual: NONREACTIVE

## 2018-06-03 LAB — HEPATITIS B CORE ANTIBODY, IGM: Hep B C IgM: NEGATIVE

## 2018-06-03 LAB — HEPATITIS B E ANTIBODY: Hep B E Ab: NEGATIVE

## 2018-06-03 LAB — HEPATITIS B SURFACE ANTIGEN: HEP B S AG: NEGATIVE

## 2018-06-03 LAB — HEPATIC FUNCTION PANEL
ALT: 9 IU/L (ref 0–32)
AST: 15 IU/L (ref 0–40)
Albumin: 4.1 g/dL (ref 3.8–4.8)
Alkaline Phosphatase: 53 IU/L (ref 39–117)
BILIRUBIN TOTAL: 0.2 mg/dL (ref 0.0–1.2)
BILIRUBIN, DIRECT: 0.1 mg/dL (ref 0.00–0.40)
TOTAL PROTEIN: 6.9 g/dL (ref 6.0–8.5)

## 2018-06-03 LAB — HEPATITIS B DNA, ULTRAQUANTITATIVE, PCR: HBV DNA SERPL PCR-ACNC: NOT DETECTED IU/mL

## 2018-06-03 LAB — HEPATITIS B E ANTIGEN: Hep B E Ag: NEGATIVE

## 2018-06-03 LAB — HEPATITIS B CORE AB W/REFLEX: Hep B Core Total Ab: NEGATIVE

## 2018-06-08 ENCOUNTER — Encounter: Payer: Self-pay | Admitting: Internal Medicine

## 2019-03-01 ENCOUNTER — Other Ambulatory Visit: Payer: Self-pay | Admitting: Internal Medicine

## 2019-03-01 DIAGNOSIS — B009 Herpesviral infection, unspecified: Secondary | ICD-10-CM

## 2019-05-31 ENCOUNTER — Ambulatory Visit: Payer: Medicaid Other | Attending: Internal Medicine

## 2019-11-13 ENCOUNTER — Other Ambulatory Visit: Payer: Self-pay | Admitting: Internal Medicine

## 2019-11-13 ENCOUNTER — Ambulatory Visit
Admission: RE | Admit: 2019-11-13 | Discharge: 2019-11-13 | Disposition: A | Discharge status: Home / Self Care | Payer: Medicaid Other | Source: Ambulatory Visit | Attending: Internal Medicine | Admitting: Internal Medicine

## 2019-11-13 DIAGNOSIS — M545 Low back pain, unspecified: Secondary | ICD-10-CM

## 2020-08-13 ENCOUNTER — Encounter (HOSPITAL_COMMUNITY): Payer: Self-pay | Admitting: Emergency Medicine

## 2020-08-13 ENCOUNTER — Other Ambulatory Visit: Payer: Self-pay

## 2020-08-13 ENCOUNTER — Ambulatory Visit (HOSPITAL_COMMUNITY)
Admission: EM | Admit: 2020-08-13 | Discharge: 2020-08-13 | Disposition: A | Discharge status: Home / Self Care | Payer: Medicaid Other | Attending: Internal Medicine | Admitting: Internal Medicine

## 2020-08-13 DIAGNOSIS — L03012 Cellulitis of left finger: Secondary | ICD-10-CM

## 2020-08-13 NOTE — ED Triage Notes (Signed)
Pt is present with a finger infection (left ring finger). Pt states that she noticed the infection last Saturday.

## 2020-08-13 NOTE — Discharge Instructions (Addendum)
Warm salt water soaks 3 times daily for 10 to 15 minutes at a time.  Please return for any increasing swelling, pain or redness

## 2020-08-13 NOTE — ED Provider Notes (Signed)
[facility]    CSN: [id number] Arrival date & time: 08/13/20  1607      History   Chief Complaint Chief Complaint  Patient presents with   finger infection    Ring finger (left )    HPI [name] [name] is a 40 y.o. female presents to urgent care today with pain and swelling around nailbed of left ring finger.  Patient states swelling present for several days.  She denies any recent fever or chills, loss of sensation or trauma.     Past Medical History:  Diagnosis Date   Depression    ok now   Herpes    on valtrex   HSV-2 infection    Hx of migraines     Patient Active Problem List   Diagnosis Date Noted   NVD (normal vaginal delivery) 05/01/2014   Indication for care in labor or delivery 04/30/2014    Past Surgical History:  Procedure Laterality Date   INDUCED ABORTION     3 elective   LAPAROSCOPIC TUBAL LIGATION Bilateral 05/29/2014   Procedure: LAPAROSCOPIC TUBAL LIGATION;  Surgeon: Kathreen Cosier, MD;  Location: [facility];  Service: Gynecology;  Laterality: Bilateral;   NO PAST SURGERIES     TUBAL LIGATION      OB History     Gravida  8   Para  5   Term  5   Preterm  0   AB  3   Living  5      SAB  0   IAB  3   Ectopic      Multiple  0   Live Births  5            Home Medications    Prior to Admission medications   Medication Sig Start Date End Date Taking? Authorizing Provider  valACYclovir (VALTREX) 1000 MG tablet Take 1 tablet (1,000 mg total) by mouth daily. 05/19/18   Marcine Matar, MD    Family History Family History  Problem Relation Age of Onset   Stroke Father    Diabetes Brother    Leukemia Maternal Aunt    Pneumonia Maternal Grandmother    Cancer Maternal Grandfather        lungs   Diabetes Paternal Grandmother    Diabetes Paternal Grandfather    Diabetes Paternal Uncle     Social History Social History   Tobacco Use   Smoking status: Never   Smokeless tobacco: Never  Vaping Use    Vaping Use: Never used  Substance Use Topics   Alcohol use: No   Drug use: No     Allergies   Patient has no known allergies.   Review of Systems As stated in HPI otherwise negative   Physical Exam Triage Vital Signs ED Triage Vitals  Enc Vitals Group     BP 08/13/20 1658 104/68     Pulse Rate 08/13/20 1658 71     Resp 08/13/20 1658 17     Temp 08/13/20 1658 97.9 F (36.6 C)     Temp src --      SpO2 08/13/20 1658 98 %     Weight --      Height --      Head Circumference --      Peak Flow --      Pain Score 08/13/20 1656 10     Pain Loc --      Pain Edu? --      Excl. in  GC? --    No data found.  Updated Vital Signs BP 104/68   Pulse 71   Temp 97.9 F (36.6 C)   Resp 17   SpO2 98%   Visual Acuity Right Eye Distance:   Left Eye Distance:   Bilateral Distance:    Right Eye Near:   Left Eye Near:    Bilateral Near:     Physical Exam Constitutional:      General: She is not in acute distress.    Appearance: Normal appearance. She is not toxic-appearing.  Skin:    General: Skin is warm and dry.     Comments: Small collection of purulent fluid with mild swelling surrounding, no erythema or streaking   Neurological:     Mental Status: She is alert.  Psychiatric:        Mood and Affect: Mood normal.        Behavior: Behavior normal.     UC Treatments / Results  Labs (all labs ordered are listed, but only abnormal results are displayed) Labs Reviewed - No data to display  EKG   Radiology No results found.  Procedures Incision and Drainage  Date/Time: 08/13/2020 5:31 PM Performed by: Rolla Etienne, NP Authorized by: Rolla Etienne, NP   Consent:    Consent obtained:  Verbal   Risks discussed:  Incomplete drainage and pain   Alternatives discussed:  No treatment and observation Universal protocol:    Procedure explained and questions answered to patient or proxy's satisfaction: yes   Location:    Type:  Abscess Pre-procedure details:     Skin preparation:  Povidone-iodine Anesthesia:    Anesthesia method:  Topical application Procedure type:    Complexity:  Simple Procedure details:    Incision types:  Stab incision   Incision depth:  Dermal   Drainage:  Purulent   Drainage amount:  Scant   Packing materials:  None Post-procedure details:    Procedure completion:  Tolerated Comments:     Small paronychia to left ring finger with scant amount of purulent drainage (including critical care time)  Medications Ordered in UC Medications - No data to display  Initial Impression / Assessment and Plan / UC Course  I have reviewed the triage vital signs and the nursing notes.  Pertinent labs & imaging results that were available during my care of the patient were reviewed by me and considered in my medical decision making (see chart for details).  Paronychia, left ring finger -mild without surrounding erythema -I&D performed with scant amount of purulent drainage -Frequent salt water soaks -Follow-up for any progressive swelling, erythema, fever or chills  Reviewed expections re: course of current medical issues. Questions answered. Outlined signs and symptoms indicating need for more acute intervention. Pt verbalized understanding. AVS given   Final Clinical Impressions(s) / UC Diagnoses   Final diagnoses:  Paronychia of finger of left hand     Discharge Instructions      Warm salt water soaks 3 times daily for 10 to 15 minutes at a time.  Please return for any increasing swelling, pain or redness     ED Prescriptions   None    PDMP not reviewed this encounter.   Rolla Etienne, NP 08/13/20 1737

## 2020-09-11 ENCOUNTER — Ambulatory Visit (HOSPITAL_COMMUNITY)
Admission: EM | Admit: 2020-09-11 | Discharge: 2020-09-11 | Disposition: A | Discharge status: Home / Self Care | Payer: Self-pay | Attending: Internal Medicine | Admitting: Internal Medicine

## 2020-09-11 ENCOUNTER — Other Ambulatory Visit: Payer: Self-pay

## 2020-09-11 DIAGNOSIS — Z20822 Contact with and (suspected) exposure to covid-19: Secondary | ICD-10-CM | POA: Insufficient documentation

## 2020-09-11 LAB — SARS CORONAVIRUS 2 (TAT 6-24 HRS): SARS Coronavirus 2: NEGATIVE

## 2020-09-11 NOTE — ED Triage Notes (Signed)
Pt presents for covis testing.

## 2020-11-03 ENCOUNTER — Encounter (HOSPITAL_COMMUNITY): Payer: Self-pay | Admitting: *Deleted

## 2020-11-03 ENCOUNTER — Ambulatory Visit (HOSPITAL_COMMUNITY)
Admission: EM | Admit: 2020-11-03 | Discharge: 2020-11-03 | Disposition: A | Discharge status: Home / Self Care | Payer: Self-pay | Attending: Student | Admitting: Student

## 2020-11-03 ENCOUNTER — Other Ambulatory Visit: Payer: Self-pay

## 2020-11-03 DIAGNOSIS — M5412 Radiculopathy, cervical region: Secondary | ICD-10-CM

## 2020-11-03 MED ORDER — IBUPROFEN 600 MG PO TABS
600.0000 mg | ORAL_TABLET | Freq: Four times a day (QID) | ORAL | 0 refills | Status: DC | PRN
  Filled 2020-11-03: qty 30, 8d supply, fill #0

## 2020-11-03 NOTE — ED Provider Notes (Addendum)
[facility]    CSN: [id number] Arrival date & time: 11/03/20  4098      History   Chief Complaint Chief Complaint  Patient presents with   Arm Injury    HPI [name] [name] is a 40 y.o. female presenting with tingling in the left arm and fingers x1 week. Medical history HSV, depression, pinched nerve. 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 Endorses 1 week of pain radiating from her L shoulder down into fingers. Tingling in 2-5th fingers. Symptoms the worst at the start of her shift. Hasn't tried medications for the symptoms. Has had these symptoms before, cannot recall treatment. Denies neck/back pain. Denies CP, SOB, dizziness, weakness. She is right handed.  HPI  Past Medical History:  Diagnosis Date   Depression    ok now   Herpes    on valtrex   HSV-2 infection    Hx of migraines     Patient Active Problem List   Diagnosis Date Noted   NVD (normal vaginal delivery) 05/01/2014   Indication for care in labor or delivery 04/30/2014    Past Surgical History:  Procedure Laterality Date   INDUCED ABORTION     3 elective   LAPAROSCOPIC TUBAL LIGATION Bilateral 05/29/2014   Procedure: LAPAROSCOPIC TUBAL LIGATION;  Surgeon: Kathreen Cosier, MD;  Location: [facility];  Service: Gynecology;  Laterality: Bilateral;   NO PAST SURGERIES     TUBAL LIGATION      OB History     Gravida  8   Para  5   Term  5   Preterm  0   AB  3   Living  5      SAB  0   IAB  3   Ectopic      Multiple  0   Live Births  5            Home Medications    Prior to Admission medications   Medication Sig Start Date End Date Taking? Authorizing Provider  ibuprofen (ADVIL) 600 MG tablet Take 1 tablet (600 mg total) by mouth every 6 (six) hours as needed. 11/03/20  Yes Rhys Martini, PA-C  valACYclovir (VALTREX) 1000 MG tablet Take 1 tablet (1,000 mg total) by mouth daily. 05/19/18   Marcine Matar, MD    Family History Family  History  Problem Relation Age of Onset   Stroke Father    Diabetes Brother    Leukemia Maternal Aunt    Pneumonia Maternal Grandmother    Cancer Maternal Grandfather        lungs   Diabetes Paternal Grandmother    Diabetes Paternal Grandfather    Diabetes Paternal Uncle     Social History Social History   Tobacco Use   Smoking status: Never   Smokeless tobacco: Never  Vaping Use   Vaping Use: Never used  Substance Use Topics   Alcohol use: No   Drug use: No     Allergies   Patient has no known allergies.   Review of Systems Review of Systems  Musculoskeletal:        L arm pain  All other systems reviewed and are negative.   Physical Exam Triage Vital Signs ED Triage Vitals  Enc Vitals Group     BP 11/03/20 1154 (!) 111/58     Pulse Rate 11/03/20 1154 (!) 55     Resp 11/03/20 1154 18     Temp 11/03/20 1154  98.3 F (36.8 C)     Temp src --      SpO2 11/03/20 1154 100 %     Weight --      Height --      Head Circumference --      Peak Flow --      Pain Score 11/03/20 1157 10     Pain Loc --      Pain Edu? --      Excl. in GC? --    No data found.  Updated Vital Signs BP (!) 111/58   Pulse (!) 55   Temp 98.3 F (36.8 C)   Resp 18   LMP 11/03/2020 (Exact Date)   SpO2 100%   Breastfeeding No   Visual Acuity Right Eye Distance:   Left Eye Distance:   Bilateral Distance:    Right Eye Near:   Left Eye Near:    Bilateral Near:     Physical Exam Vitals reviewed.  Constitutional:      General: She is not in acute distress.    Appearance: Normal appearance. She is not ill-appearing.  HENT:     Head: Normocephalic and atraumatic.  Cardiovascular:     Rate and Rhythm: Normal rate and regular rhythm.     Heart sounds: Normal heart sounds.  Pulmonary:     Effort: Pulmonary effort is normal.     Breath sounds: Normal breath sounds and air entry.  Abdominal:     Tenderness: There is no abdominal tenderness. There is no right CVA tenderness,  left CVA tenderness, guarding or rebound.  Musculoskeletal:     Cervical back: Normal range of motion. No swelling, deformity, signs of trauma, rigidity, spasms, tenderness, bony tenderness or crepitus. No pain with movement.     Thoracic back: No swelling, deformity, signs of trauma, spasms, tenderness or bony tenderness. Normal range of motion. No scoliosis.     Lumbar back: No swelling, deformity, signs of trauma, spasms, tenderness or bony tenderness. Normal range of motion. Negative right straight leg raise test and negative left straight leg raise test. No scoliosis.     Comments: Negative spurling bilaterally. No pain elicited L arm abduction. Strength and sensation intact bilateral arms. Positive phalen. Grip strength 5/5 bilaterally, radial pulse 2+, cap refill <2 seconds. No midline spinous tenderness, deformity, stepoff.  Absolutely no other injury, deformity, tenderness, ecchymosis, abrasion.  Neurological:     General: No focal deficit present.     Mental Status: She is alert.     Cranial Nerves: No cranial nerve deficit.  Psychiatric:        Mood and Affect: Mood normal.        Behavior: Behavior normal.        Thought Content: Thought content normal.        Judgment: Judgment normal.     UC Treatments / Results  Labs (all labs ordered are listed, but only abnormal results are displayed) Labs Reviewed - No data to display  EKG   Radiology No results found.  Procedures Procedures (including critical care time)  Medications Ordered in UC Medications - No data to display  Initial Impression / Assessment and Plan / UC Course  I have reviewed the triage vital signs and the nursing notes.  Pertinent labs & imaging results that were available during my care of the patient were reviewed by me and considered in my medical decision making (see chart for details).     This patient is a very pleasant  40 y.o. year old female presenting with cervical radiculitis related to  overuse. Suspect there is also a carpal tunnel component. Will proceed with wrist brace, ibuprofen. She declines Solumedrol IM. F/u with ortho if symptoms worsen/persist. She is currently on her period. ED return precautions discussed. Patient verbalizes understanding and agreement.  .   Final Clinical Impressions(s) / UC Diagnoses   Final diagnoses:  Cervical radiculitis     Discharge Instructions      -Use your wrist brace during the work day. You can also use this at night if symptoms persist.  -You can take Tylenol up to 1000 mg 3 times daily, and ibuprofen up to 600 mg 3 times daily with food.  You can take these together, or alternate every 3-4 hours. -If symptoms persist in 7 days, follow-up with an orthopedist. I recommend [facility] at [address], [place], [place] [address]. You can schedule an appointment by calling [phone]) or online ([email or web address]), but they also have a walk-in clinic M-F 8a-8p and Sat 10a-3p.       ED Prescriptions     Medication Sig Dispense Auth. Provider   ibuprofen (ADVIL) 600 MG tablet Take 1 tablet (600 mg total) by mouth every 6 (six) hours as needed. 30 tablet Rhys Martini, PA-C      PDMP not reviewed this encounter.   Rhys Martini, PA-C 11/03/20 1228    Rhys Martini, PA-C 11/03/20 1228

## 2020-11-03 NOTE — Discharge Instructions (Addendum)
-  Use your wrist brace during the work day. You can also use this at night if symptoms persist.  -You can take Tylenol up to 1000 mg 3 times daily, and ibuprofen up to 600 mg 3 times daily with food.  You can take these together, or alternate every 3-4 hours. -If symptoms persist in 7 days, follow-up with an orthopedist. I recommend [facility] at [address] [place], [place] [address]. You can schedule an appointment by calling [phone]) or online ([email or web address]), but they also have a walk-in clinic M-F 8a-8p and Sat 10a-3p.

## 2020-11-03 NOTE — ED Triage Notes (Signed)
Pt reports she has tingling in Lt arm down to fingers for one week.

## 2020-11-04 ENCOUNTER — Other Ambulatory Visit: Payer: Self-pay

## 2020-11-11 ENCOUNTER — Other Ambulatory Visit: Payer: Self-pay

## 2021-07-20 ENCOUNTER — Other Ambulatory Visit: Payer: Self-pay | Admitting: Internal Medicine

## 2021-07-20 DIAGNOSIS — Z1231 Encounter for screening mammogram for malignant neoplasm of breast: Secondary | ICD-10-CM

## 2021-07-21 ENCOUNTER — Ambulatory Visit: Payer: Medicaid Other

## 2021-08-20 ENCOUNTER — Ambulatory Visit
Admission: RE | Admit: 2021-08-20 | Discharge: 2021-08-20 | Disposition: A | Discharge status: Home / Self Care | Payer: Medicaid Other | Source: Ambulatory Visit | Attending: Internal Medicine | Admitting: Internal Medicine

## 2021-08-20 DIAGNOSIS — Z1231 Encounter for screening mammogram for malignant neoplasm of breast: Secondary | ICD-10-CM

## 2021-09-11 ENCOUNTER — Ambulatory Visit: Payer: Medicaid Other | Admitting: Family

## 2021-09-15 ENCOUNTER — Ambulatory Visit: Payer: Medicaid Other | Admitting: Family

## 2021-09-17 ENCOUNTER — Encounter: Payer: Self-pay | Admitting: Family

## 2021-09-17 ENCOUNTER — Ambulatory Visit (INDEPENDENT_AMBULATORY_CARE_PROVIDER_SITE_OTHER): Payer: Medicaid Other | Admitting: Family

## 2021-09-17 ENCOUNTER — Other Ambulatory Visit: Payer: Self-pay

## 2021-09-17 DIAGNOSIS — B009 Herpesviral infection, unspecified: Secondary | ICD-10-CM

## 2021-09-17 MED ORDER — VALACYCLOVIR HCL 1 G PO TABS: ORAL_TABLET | ORAL | 1 refills | Status: DC

## 2021-09-17 NOTE — Progress Notes (Signed)
Subjective:    Patient ID: [name] [name], female    DOB: [birth year], 41 y.o.   MRN: [id number]  Chief Complaint  Patient presents with   New Patient (Initial Visit)   HSV    HPI:  [name] [name] is a 41 y.o. female with previous medical history of anxiety/depression presenting today for follow up visit for HSV.  [name] [name] is known to the [facility] and was last seen by Dr. Drue Second on 09/28/2016 and noted to have herpes simplex virus and no infection with HIV disease.  Patient was discussed in detail that she did not have HIV infection and may have been using HSV interchangeably.  At that time she did not appear to have frequent outbreaks with no prophylaxis recommended.  Presenting today for HSV follow-up after 5 years.  [name] [name] has not been taking the valacyclovir and has had 1-2 outbreaks in the last 5 years since she was last seen.  Has concerns about taking medication daily to prevent outbreaks and questions regarding injectable medications.  No current symptoms.  When she does have outbreaks they are generally genital.  Her last outbreaks she has not received treatment for her and have cleared independently.    No Known Allergies    Outpatient Medications Prior to Visit  Medication Sig Dispense Refill   ibuprofen (ADVIL) 600 MG tablet Take 1 tablet (600 mg total) by mouth every 6 (six) hours as needed. 30 tablet 0   valACYclovir (VALTREX) 1000 MG tablet Take 1 tablet (1,000 mg total) by mouth daily. (Patient not taking: Reported on 09/17/2021) 30 tablet 5   No facility-administered medications prior to visit.     Past Medical History:  Diagnosis Date   Depression    ok now   Herpes    on valtrex   HSV-2 infection    Hx of migraines      Past Surgical History:  Procedure Laterality Date   INDUCED ABORTION     3 elective   LAPAROSCOPIC TUBAL LIGATION Bilateral 05/29/2014   Procedure: LAPAROSCOPIC TUBAL LIGATION;  Surgeon: Kathreen Cosier, MD;  Location: [facility];   Service: Gynecology;  Laterality: Bilateral;   NO PAST SURGERIES     TUBAL LIGATION         Review of Systems  Constitutional:  Negative for appetite change, chills, diaphoresis, fatigue, fever and unexpected weight change.  Eyes:        Negative for acute change in vision  Respiratory:  Negative for chest tightness, shortness of breath and wheezing.   Cardiovascular:  Negative for chest pain.  Gastrointestinal:  Negative for diarrhea, nausea and vomiting.  Genitourinary:  Negative for dysuria, pelvic pain and vaginal discharge.  Musculoskeletal:  Negative for neck pain and neck stiffness.  Skin:  Negative for rash.  Neurological:  Negative for seizures, syncope, weakness and headaches.  Hematological:  Negative for adenopathy. Does not bruise/bleed easily.  Psychiatric/Behavioral:  Negative for hallucinations.       Objective:    BP 113/76   Pulse 88   Temp 98 F (36.7 C) (Oral)   Ht 5\' 7"  (1.702 m)   Wt 164 lb (74.4 kg)   LMP 07/29/2021 (Approximate)   SpO2 100%   BMI 25.69 kg/m  Nursing note and vital signs reviewed.  Physical Exam Constitutional:      General: She is not in acute distress.    Appearance: She is well-developed.  Eyes:     Conjunctiva/sclera: Conjunctivae normal.  Cardiovascular:  Rate and Rhythm: Normal rate and regular rhythm.     Heart sounds: Normal heart sounds. No murmur heard.    No friction rub. No gallop.  Pulmonary:     Effort: Pulmonary effort is normal. No respiratory distress.     Breath sounds: Normal breath sounds. No wheezing or rales.  Chest:     Chest wall: No tenderness.  Abdominal:     General: Bowel sounds are normal.     Palpations: Abdomen is soft.     Tenderness: There is no abdominal tenderness.  Musculoskeletal:     Cervical back: Neck supple.  Lymphadenopathy:     Cervical: No cervical adenopathy.  Skin:    General: Skin is warm and dry.     Findings: No rash.  Neurological:     Mental Status: She is  alert and oriented to person, place, and time.  Psychiatric:        Behavior: Behavior normal.        Thought Content: Thought content normal.        Judgment: Judgment normal.         05/19/2018   10:04 AM  Depression screen PHQ 2/9  Decreased Interest 0  Down, Depressed, Hopeless 1  PHQ - 2 Score 1       Assessment & Plan:    Patient Active Problem List   Diagnosis Date Noted   Herpes simplex infection 09/18/2021   NVD (normal vaginal delivery) 05/01/2014   Indication for care in labor or delivery 04/30/2014     Problem List Items Addressed This Visit       Other   Herpes simplex infection    [name] [name] has previous HSV infection with 1-2 outbreaks in the last 5 years and no current outbreaks.  Discussed given the infrequent symptoms/outbreaks no prophylaxis is recommended at this time as there are always risks associated with taking medications.  Discussed using valacyclovir as needed for outbreaks when they occur.  Further management can be provided by internal medicine and in the interim can plan for follow-up in 6 months or sooner if needed.      Relevant Medications   valACYclovir (VALTREX) 1000 MG tablet     I have changed [name] [name] valACYclovir. I am also having her maintain her ibuprofen.   Meds ordered this encounter  Medications   valACYclovir (VALTREX) 1000 MG tablet    Sig: Take 1 tablet by mouth daily for 5 days as need for outbreak.    Dispense:  20 tablet    Refill:  1    Order Specific Question:   Supervising Provider    Answer:   Judyann Munson [[id number]]     Follow-up: Return in about 6 months (around 03/20/2022), or if symptoms worsen or fail to improve.   Marcos Eke, MSN, FNP-C Nurse Practitioner [facility] Main number: [phone]

## 2021-09-17 NOTE — Patient Instructions (Addendum)
Nice to see you.  Take the valacyclovir 1 tablet daily for 5 days at the onset of an outbreak.   Use medication as needed.   Plan for follow up in 6 months or sooner if needed with Tammy Sours or Dr. Drue Second.   Have a great day and stay safe!   Genital Herpes Genital herpes is a common sexually transmitted infection (STI) that is caused by a virus. The virus spreads from person to person through contact with a sore, infected saliva, or infected skin. The virus can cause itching, blisters, and sores around the genitals or rectum. During an outbreak of infection, symptoms may last for several days and then go away. However, the virus remains in the body, so more outbreaks may happen in the future. The time between outbreaks varies and can be from months to years. Genital herpes can affect anyone. It is particularly concerning for pregnant women because the virus can be passed to the baby during delivery. Genital herpes is also a concern for people who have a weak disease-fighting system (immune system). What are the causes? This condition is caused by the herpes simplex virus, type 1 or type 2 (HSV-1 or HSV-2). The virus may spread through: Sexual contact with an infected person, including vaginal, anal, and oral sex. Contact with a herpes sore. The skin. This means that you can get herpes from an infected partner even if there are no blisters or sores present. Your partner may not know that he or she is infected. What increases the risk? You are more likely to develop this condition if: You have sex with many partners. You do not use latex or polyurethane condoms during sex. What are the signs or symptoms? Most people do not have symptoms or they have mild symptoms that may be mistaken for other skin problems. Symptoms may include: Small, red bumps near the genitals, rectum, or mouth. These bumps turn into blisters and then sores. Flu-like (influenza-like) symptoms, including: Fever. Body  aches. Swollen lymph nodes. Headache. Painful urination. Pain and itching in the genital area or rectal area. Vaginal discharge. Tingling or shooting pain in the legs and buttocks. Generally, symptoms are more severe and last longer during the first (primary) outbreak. Influenza-like symptoms are also more common during the primary outbreak. How is this diagnosed? This condition may be diagnosed based on: A physical exam. Your medical history. Blood tests. A test of a fluid sample (culture) from an open sore. How is this treated? There is no cure for this condition, but treatment with antiviral medicines can do the following: Speed up healing and relieve symptoms. Help to reduce the spread of the virus to sexual partners. Limit the chance of future outbreaks, or make future outbreaks shorter. Lessen symptoms of future outbreaks. Your health care provider may also recommend over-the-counter medicines to help with pain and itching. Follow these instructions at home: If you have an outbreak:  Keep the affected areas dry and clean. Avoid rubbing or touching blisters and sores. If you do touch blisters or sores: Wash your hands thoroughly with soap and water for at least 20 seconds. If soap and water are not available, use an alcohol-based hand sanitizer. Do not touch your eyes afterward. Sexual activity Do not have sexual contact during active outbreaks. Practice safe sex. Herpes can spread even if your partner does not have blisters or sores. Latex or polyurethane condoms and female condoms may help prevent the spread of the herpes virus. Managing pain and discomfort If directed,  put ice on the painful area. To do this: Put ice in a plastic bag. Place a towel between your skin and the bag. Leave the ice on for 20 minutes, 2-3 times a day. Remove the ice if your skin turns bright red. This is very important. If you cannot feel pain, heat, or cold, you have a greater risk of damage to  the area. If told, take a cool sitz bath to help relieve pain or itching. A sitz bath is a water bath that you take while sitting down in water that is deep enough to cover your hips and buttocks. General instructions Take over-the-counter and prescription medicines only as told by your health care provider. If you were prescribed an antiviral medicine, use it as told by your health care provider. Do not stop using the antiviral even if you start to feel better. Keep all follow-up visits. This is important. How is this prevented? Use condoms. Although you can get genital herpes during sexual contact even with the use of a condom, a condom can provide some protection. Avoid having multiple sexual partners. Talk with your sexual partner about any symptoms either of you may have. Also, talk with your partner about any history of STIs. Do not have sexual contact if you have active symptoms of genital herpes. Contact a health care provider if: Your symptoms are not improving with medicine. Your symptoms return, or you have new symptoms. You have a fever. You have abdominal pain. You have redness, swelling, or pain in your eye. You notice new sores on other parts of your body. You have had herpes and you become pregnant or plan to become pregnant. Get help right away if: You have symptoms of viral meningitis. This is rare but may happen if the virus spreads to the brain. Symptoms may include: Severe headache or stiff neck. Muscle aches. Nausea and vomiting. Sensitivity to light. Summary Genital herpes is a common sexually transmitted infection (STI) that is caused by the herpes simplex virus, type 1 or type 2 (HSV-1 or HSV-2). These viruses are most often spread through sexual contact with an infected person. You are more likely to develop this condition if you have sex with many partners or you do not use condoms during sex. Most people do not have symptoms or have mild symptoms that may be  mistaken for other skin problems. Symptoms occur as outbreaks that may happen months or years apart. There is no cure for this condition, but treatment with oral antiviral medicines can reduce symptoms, reduce the chance of spreading the virus to a partner, prevent future outbreaks, or shorten future outbreaks. This information is not intended to replace advice given to you by your health care provider. Make sure you discuss any questions you have with your health care provider. Document Revised: 11/20/2020 Document Reviewed: 11/20/2020 Elsevier Patient Education  2023 [employer].

## 2021-09-18 DIAGNOSIS — B009 Herpesviral infection, unspecified: Secondary | ICD-10-CM | POA: Insufficient documentation

## 2021-09-18 NOTE — Assessment & Plan Note (Signed)
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  Discussed given the infrequent symptoms/outbreaks no prophylaxis is recommended at this time as there are always risks associated with taking medications.  Discussed using valacyclovir as needed for outbreaks when they occur.  Further management can be provided by internal medicine and in the interim can plan for follow-up in 6 months or sooner if needed.

## 2021-10-01 ENCOUNTER — Ambulatory Visit: Payer: Medicaid Other

## 2022-03-15 ENCOUNTER — Ambulatory Visit: Payer: Medicaid Other | Admitting: Internal Medicine

## 2022-08-07 ENCOUNTER — Encounter (HOSPITAL_COMMUNITY): Payer: Self-pay | Admitting: *Deleted

## 2022-08-07 ENCOUNTER — Ambulatory Visit (HOSPITAL_COMMUNITY)
Admission: EM | Admit: 2022-08-07 | Discharge: 2022-08-07 | Disposition: A | Discharge status: Home / Self Care | Payer: Self-pay | Attending: Emergency Medicine | Admitting: Emergency Medicine

## 2022-08-07 ENCOUNTER — Other Ambulatory Visit: Payer: Self-pay

## 2022-08-07 DIAGNOSIS — S40811A Abrasion of right upper arm, initial encounter: Secondary | ICD-10-CM

## 2022-08-07 DIAGNOSIS — M79601 Pain in right arm: Secondary | ICD-10-CM

## 2022-08-07 MED ORDER — MUPIROCIN CALCIUM 2 % EX CREA: 1.0000 | TOPICAL_CREAM | Freq: Two times a day (BID) | CUTANEOUS | 0 refills | Status: DC

## 2022-08-07 MED ORDER — METHOCARBAMOL 500 MG PO TABS: 500.0000 mg | ORAL_TABLET | Freq: Two times a day (BID) | ORAL | 0 refills | Status: DC

## 2022-08-07 MED ORDER — IBUPROFEN 800 MG PO TABS: 800.0000 mg | ORAL_TABLET | Freq: Three times a day (TID) | ORAL | 0 refills | Status: DC

## 2022-08-07 NOTE — Discharge Instructions (Addendum)
Please keep your wound clean and dry.  Please clean the area with antibacterial solution like Dial soap, or warm water with a little bit of Hibiclens, available over-the-counter.  You can keep the area wrapped during the day.  You will most likely be stiff and sore, your shortness will be worse tomorrow.  You can take the anti-inflammatories 3 times daily.  The muscle relaxers can help with muscle pain and stiffness, do not drink or drive on them as they may make you drowsy.  Please seek immediate care if you develop emesis, syncope, weakness, incontinence, or any new concerning symptoms.
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
## 2022-09-24 ENCOUNTER — Other Ambulatory Visit: Payer: Self-pay | Admitting: Family

## 2022-09-24 DIAGNOSIS — B009 Herpesviral infection, unspecified: Secondary | ICD-10-CM

## 2022-11-14 ENCOUNTER — Other Ambulatory Visit: Payer: Self-pay

## 2022-11-14 ENCOUNTER — Encounter (HOSPITAL_COMMUNITY): Payer: Self-pay | Admitting: *Deleted

## 2022-11-14 ENCOUNTER — Ambulatory Visit (HOSPITAL_COMMUNITY)
Admission: EM | Admit: 2022-11-14 | Discharge: 2022-11-14 | Disposition: A | Discharge status: Home / Self Care | Payer: Medicaid Other | Attending: Emergency Medicine | Admitting: Emergency Medicine

## 2022-11-14 DIAGNOSIS — K529 Noninfective gastroenteritis and colitis, unspecified: Secondary | ICD-10-CM

## 2022-11-14 MED ORDER — ONDANSETRON 4 MG PO TBDP
4.0000 mg | ORAL_TABLET | Freq: Once | ORAL | Status: AC
  Administered 2022-11-14: 4 mg via ORAL

## 2022-11-14 MED ORDER — ONDANSETRON 4 MG PO TBDP
ORAL_TABLET | ORAL | Status: AC
  Filled 2022-11-14: qty 1

## 2022-11-14 MED ORDER — ONDANSETRON 4 MG PO TBDP: 4.0000 mg | ORAL_TABLET | Freq: Three times a day (TID) | ORAL | 0 refills | Status: DC | PRN

## 2022-11-14 NOTE — ED Provider Notes (Addendum)
[facility]    CSN: [id number] Arrival date & time: 11/14/22  1102      History   Chief Complaint Chief Complaint  Patient presents with   Emesis   Diarrhea    HPI [name] [name] is a 42 y.o. female.   Patient presents to clinic with complaints of nausea, vomiting and diarrhea that started last night.  She had 1 episode of diarrhea and 2 episodes of vomiting.  She has not had any vomiting today, but she is nauseous.  She has not tried any medications or interventions for her symptoms.  Denies any recent sick contacts, denies any changes in her diet, or new foods.  She is tolerating oral fluids today, had a peach soda this morning.  No abdominal pain, no fevers or dysuria.    The history is provided by the patient and medical records.  Emesis Associated symptoms: diarrhea   Associated symptoms: no abdominal pain and no fever   Diarrhea Associated symptoms: vomiting   Associated symptoms: no abdominal pain and no fever     Past Medical History:  Diagnosis Date   Depression    ok now   Herpes    on valtrex   HSV-2 infection    Hx of migraines     Patient Active Problem List   Diagnosis Date Noted   Herpes simplex infection 09/18/2021   NVD (normal vaginal delivery) 05/01/2014   Indication for care in labor or delivery 04/30/2014    Past Surgical History:  Procedure Laterality Date   INDUCED ABORTION     3 elective   LAPAROSCOPIC TUBAL LIGATION Bilateral 05/29/2014   Procedure: LAPAROSCOPIC TUBAL LIGATION;  Surgeon: Kathreen Cosier, MD;  Location: [facility];  Service: Gynecology;  Laterality: Bilateral;   TUBAL LIGATION      OB History     Gravida  8   Para  5   Term  5   Preterm  0   AB  3   Living  5      SAB  0   IAB  3   Ectopic      Multiple  0   Live Births  5            Home Medications    Prior to Admission medications   Medication Sig Start Date End Date Taking? Authorizing Provider  ondansetron  (ZOFRAN-ODT) 4 MG disintegrating tablet Take 1 tablet (4 mg total) by mouth every 8 (eight) hours as needed for nausea or vomiting. 11/14/22  Yes Rinaldo Ratel, [place], FNP  valACYclovir (VALTREX) 1000 MG tablet TAKE 1 TABLET BY MOUTH DAILY FOR 5 DAYS AS NEED FOR OUTBREAK. 09/24/22  Yes Veryl Speak, FNP  ibuprofen (ADVIL) 800 MG tablet Take 1 tablet (800 mg total) by mouth 3 (three) times daily. 08/07/22   Wynona Duhamel, [place], FNP  methocarbamol (ROBAXIN) 500 MG tablet Take 1 tablet (500 mg total) by mouth 2 (two) times daily. 08/07/22   Mainor Hellmann, [place], FNP  mupirocin cream (BACTROBAN) 2 % Apply 1 Application topically 2 (two) times daily. 08/07/22   Joleena Weisenburger, [place], FNP    Family History Family History  Problem Relation Age of Onset   Stroke Father    Leukemia Maternal Aunt    Diabetes Paternal Uncle    Pneumonia Maternal Grandmother    Cancer Maternal Grandfather        lungs   Diabetes Paternal Grandmother    Diabetes Paternal Grandfather  Breast cancer Cousin    Diabetes Brother     Social History Social History   Tobacco Use   Smoking status: Never   Smokeless tobacco: Never  Vaping Use   Vaping status: Former  Substance Use Topics   Alcohol use: Yes    Comment: occasionally   Drug use: No     Allergies   Patient has no known allergies.   Review of Systems Review of Systems  Constitutional:  Negative for fever.  Gastrointestinal:  Positive for diarrhea, nausea and vomiting. Negative for abdominal pain.  Genitourinary:  Negative for dysuria.     Physical Exam Triage Vital Signs ED Triage Vitals [11/14/22 1124]  Encounter Vitals Group     BP 106/64     Systolic BP Percentile      Diastolic BP Percentile      Pulse Rate 69     Resp 16     Temp 98.1 F (36.7 C)     Temp Source Oral     SpO2 98 %     Weight      Height      Head Circumference      Peak Flow      Pain Score 0     Pain Loc      Pain Education      Exclude from Growth Chart     No data found.  Updated Vital Signs BP 106/64   Pulse 69   Temp 98.1 F (36.7 C) (Oral)   Resp 16   LMP 10/26/2022 (Approximate)   SpO2 98%   Visual Acuity Right Eye Distance:   Left Eye Distance:   Bilateral Distance:    Right Eye Near:   Left Eye Near:    Bilateral Near:     Physical Exam Vitals and nursing note reviewed.  Constitutional:      Appearance: Normal appearance.  HENT:     Head: Normocephalic and atraumatic.     Right Ear: External ear normal.     Left Ear: External ear normal.     Nose: Nose normal.     Mouth/Throat:     Mouth: Mucous membranes are moist.  Eyes:     General: No scleral icterus. Cardiovascular:     Rate and Rhythm: Normal rate.  Pulmonary:     Effort: Pulmonary effort is normal. No respiratory distress.  Abdominal:     General: Abdomen is flat. Bowel sounds are normal. There is no distension.     Palpations: Abdomen is soft. There is no mass.     Tenderness: There is abdominal tenderness in the right lower quadrant. There is no guarding or rebound.     Hernia: No hernia is present.     Comments: Mild RLQ TTP. No rebound or guarding.   Musculoskeletal:        General: Normal range of motion.  Skin:    General: Skin is warm and dry.  Neurological:     General: No focal deficit present.     Mental Status: She is alert and oriented to person, place, and time.  Psychiatric:        Mood and Affect: Mood normal.        Behavior: Behavior normal. Behavior is cooperative.      UC Treatments / Results  Labs (all labs ordered are listed, but only abnormal results are displayed) Labs Reviewed - No data to display  EKG   Radiology No results found.  Procedures Procedures (including critical  care time)  Medications Ordered in UC Medications  ondansetron (ZOFRAN-ODT) disintegrating tablet 4 mg (has no administration in time range)    Initial Impression / Assessment and Plan / UC Course  I have reviewed the triage vital  signs and the nursing notes.  Pertinent labs & imaging results that were available during my care of the patient were reviewed by me and considered in my medical decision making (see chart for details).  Vitals and triage reviewed, patient is hemodynamically stable.  Abdomen is soft with mild right lower quadrant tenderness, without rebound or guarding or other red flag symptoms requiring emergent imaging.  Suspect a viral gastroenteritis.  Given ODT Zofran in clinic and sent into pharmacy.  Bland diet discussed and gradual diet progression.  Work note provided.  Plan of care, follow-up care and return precautions given, no questions at this time.     Final Clinical Impressions(s) / UC Diagnoses   Final diagnoses:  Gastroenteritis     Discharge Instructions      Ensure you are eating a bland diet such as bananas, rice, toast and applesauce.  Drink at least 64 ounces of water daily, you can also add an electrolyte solution like Pedialyte, liquid IV or Gatorade to help replenish your electrolytes.  You can use the nausea medication as needed.  Return to clinic for any abdominal pain, fevers, if you are unable to hold down food or fluids, or any new acute changes.     ED Prescriptions     Medication Sig Dispense Auth. Provider   ondansetron (ZOFRAN-ODT) 4 MG disintegrating tablet Take 1 tablet (4 mg total) by mouth every 8 (eight) hours as needed for nausea or vomiting. 20 tablet Ahlana Slaydon, [place], [place]      PDMP not reviewed this encounter.       Graci Hulce, [place], [place] 11/14/22 1148

## 2022-11-14 NOTE — Discharge Instructions (Addendum)
Ensure you are eating a bland diet such as bananas, rice, toast and applesauce.  Drink at least 64 ounces of water daily, you can also add an electrolyte solution like Pedialyte, liquid IV or Gatorade to help replenish your electrolytes.  You can use the nausea medication as needed.  Return to clinic for any abdominal pain, fevers, if you are unable to hold down food or fluids, or any new acute changes.

## 2022-11-14 NOTE — ED Triage Notes (Addendum)
Reports one episode diarrhea and 2 episodes vomiting since last night. States able to keep down PO fluids. Denies fever or abd pain. Denies any nausea at present. Requesting note for work.

## 2022-11-15 ENCOUNTER — Ambulatory Visit: Payer: Medicaid Other

## 2022-11-15 ENCOUNTER — Other Ambulatory Visit: Payer: Self-pay | Admitting: Internal Medicine

## 2022-11-15 DIAGNOSIS — Z1231 Encounter for screening mammogram for malignant neoplasm of breast: Secondary | ICD-10-CM

## 2022-11-24 ENCOUNTER — Ambulatory Visit: Payer: Medicaid Other

## 2022-11-26 ENCOUNTER — Ambulatory Visit
Admission: RE | Admit: 2022-11-26 | Discharge: 2022-11-26 | Disposition: A | Discharge status: Home / Self Care | Payer: Medicaid Other | Source: Ambulatory Visit | Attending: Internal Medicine | Admitting: Internal Medicine

## 2022-11-26 DIAGNOSIS — Z1231 Encounter for screening mammogram for malignant neoplasm of breast: Secondary | ICD-10-CM

## 2022-12-17 ENCOUNTER — Encounter (HOSPITAL_COMMUNITY): Payer: Self-pay | Admitting: *Deleted

## 2022-12-17 ENCOUNTER — Other Ambulatory Visit: Payer: Self-pay

## 2022-12-17 ENCOUNTER — Ambulatory Visit (HOSPITAL_COMMUNITY)
Admission: EM | Admit: 2022-12-17 | Discharge: 2022-12-17 | Disposition: A | Discharge status: Home / Self Care | Payer: Medicaid Other | Attending: Emergency Medicine | Admitting: Emergency Medicine

## 2022-12-17 DIAGNOSIS — M545 Low back pain, unspecified: Secondary | ICD-10-CM | POA: Insufficient documentation

## 2022-12-17 DIAGNOSIS — R1031 Right lower quadrant pain: Secondary | ICD-10-CM | POA: Insufficient documentation

## 2022-12-17 DIAGNOSIS — R1032 Left lower quadrant pain: Secondary | ICD-10-CM | POA: Insufficient documentation

## 2022-12-17 DIAGNOSIS — Y92410 Unspecified street and highway as the place of occurrence of the external cause: Secondary | ICD-10-CM | POA: Insufficient documentation

## 2022-12-17 LAB — POCT URINALYSIS DIP (MANUAL ENTRY)
Bilirubin, UA: NEGATIVE
Glucose, UA: NEGATIVE mg/dL
Ketones, POC UA: NEGATIVE mg/dL
Leukocytes, UA: NEGATIVE
Nitrite, UA: NEGATIVE
Spec Grav, UA: 1.02 (ref 1.010–1.025)
Urobilinogen, UA: >=8 U/dL — AB
pH, UA: 8.5 — AB (ref 5.0–8.0)

## 2022-12-17 LAB — POCT URINE PREGNANCY: Preg Test, Ur: NEGATIVE

## 2022-12-17 MED ORDER — IBUPROFEN 800 MG PO TABS
800.0000 mg | ORAL_TABLET | Freq: Once | ORAL | Status: AC
  Administered 2022-12-17: 800 mg via ORAL

## 2022-12-17 MED ORDER — LIDOCAINE 5 % EX PTCH: 1.0000 | MEDICATED_PATCH | CUTANEOUS | 0 refills | Status: DC

## 2022-12-17 MED ORDER — IBUPROFEN 800 MG PO TABS
ORAL_TABLET | ORAL | Status: AC
  Filled 2022-12-17: qty 1

## 2022-12-17 MED ORDER — IBUPROFEN 800 MG PO TABS: 800.0000 mg | ORAL_TABLET | Freq: Three times a day (TID) | ORAL | 0 refills | Status: DC

## 2022-12-17 MED ORDER — CYCLOBENZAPRINE HCL 10 MG PO TABS: 10.0000 mg | ORAL_TABLET | Freq: Two times a day (BID) | ORAL | 0 refills | Status: DC | PRN
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
## 2022-12-17 NOTE — Discharge Instructions (Addendum)
You can take the muscle relaxer (Flexeril) twice daily. If the medication makes you drowsy, take only at bed time. Ibuprofen can be used every 6 hours Lidocaine patch can be applied for 12 hours at a time Avoid heavy lifting and strenuous activity It may take several days to a week for symptoms to improve. Please return or follow up with your primary care provider if needed.

## 2022-12-19 LAB — URINE CULTURE: Culture: <10000 — AB

## 2023-01-26 ENCOUNTER — Other Ambulatory Visit: Payer: Self-pay | Admitting: Sports Medicine

## 2023-01-26 ENCOUNTER — Ambulatory Visit
Admission: RE | Admit: 2023-01-26 | Discharge: 2023-01-26 | Disposition: A | Discharge status: Home / Self Care | Payer: Self-pay | Source: Ambulatory Visit | Attending: Sports Medicine | Admitting: Sports Medicine

## 2023-01-26 DIAGNOSIS — M545 Low back pain, unspecified: Secondary | ICD-10-CM

## 2023-01-26 DIAGNOSIS — M542 Cervicalgia: Secondary | ICD-10-CM

## 2023-01-26 DIAGNOSIS — M546 Pain in thoracic spine: Secondary | ICD-10-CM

## 2023-04-18 ENCOUNTER — Encounter (HOSPITAL_COMMUNITY): Payer: Self-pay

## 2023-04-18 ENCOUNTER — Other Ambulatory Visit: Payer: Self-pay

## 2023-04-18 ENCOUNTER — Ambulatory Visit (HOSPITAL_COMMUNITY)
Admission: RE | Admit: 2023-04-18 | Discharge: 2023-04-18 | Disposition: A | Discharge status: Home / Self Care | Payer: Medicaid Other | Source: Ambulatory Visit | Attending: Emergency Medicine | Admitting: Emergency Medicine

## 2023-04-18 VITALS — BP 116/71 | HR 90 | Temp 97.9°F | Resp 18

## 2023-04-18 DIAGNOSIS — L03012 Cellulitis of left finger: Secondary | ICD-10-CM

## 2023-04-18 MED ORDER — IBUPROFEN 800 MG PO TABS: 800.0000 mg | ORAL_TABLET | Freq: Three times a day (TID) | ORAL | 0 refills | Status: DC

## 2023-04-18 MED ORDER — AMOXICILLIN-POT CLAVULANATE 875-125 MG PO TABS: 1.0000 | ORAL_TABLET | Freq: Two times a day (BID) | ORAL | 0 refills | Status: AC

## 2023-04-18 NOTE — Discharge Instructions (Signed)
 Please take Augmentin as prescribed. Take with food to avoid upset stomach. Finish the FULL course of medicine --you should not have any leftover pills. It can take 2 to 3 days to start working  In the meantime you can use ibuprofen every 6 hours.  Soak the finger in clean warm water several times daily

## 2023-04-18 NOTE — ED Triage Notes (Signed)
 Right middle finger with pus around nailbed.  Noticed this 2-3 days ago.

## 2023-04-18 NOTE — ED Provider Notes (Signed)
 [facility]    CSN: [id number] Arrival date & time: 04/18/23  1444      History   Chief Complaint Chief Complaint  Patient presents with   Hand Pain   Appointment    15:30    HPI [name] [name] is a 43 y.o. female.  2-3 day history of finger swelling and pain Left middle finger. Noticed some pus around nailbed Pain is rated 10/10 currently  Tried to pop it herself at home with a needle No meds yet  Does have history of this  Sometimes bites nails   Past Medical History:  Diagnosis Date   Depression    ok now   Herpes    on valtrex   HSV-2 infection    Hx of migraines     Patient Active Problem List   Diagnosis Date Noted   Herpes simplex infection 09/18/2021   NVD (normal vaginal delivery) 05/01/2014   Indication for care in labor or delivery 04/30/2014    Past Surgical History:  Procedure Laterality Date   INDUCED ABORTION     3 elective   LAPAROSCOPIC TUBAL LIGATION Bilateral 05/29/2014   Procedure: LAPAROSCOPIC TUBAL LIGATION;  Surgeon: Kathreen Cosier, MD;  Location: [facility];  Service: Gynecology;  Laterality: Bilateral;   TUBAL LIGATION      OB History     Gravida  8   Para  5   Term  5   Preterm  0   AB  3   Living  5      SAB  0   IAB  3   Ectopic      Multiple  0   Live Births  5            Home Medications    Prior to Admission medications   Medication Sig Start Date End Date Taking? Authorizing Provider  amoxicillin-clavulanate (AUGMENTIN) 875-125 MG tablet Take 1 tablet by mouth every 12 (twelve) hours for 7 days. 04/18/23 04/25/23 Yes Tonja Jezewski, Lurena Joiner, PA-C  ibuprofen (ADVIL) 800 MG tablet Take 1 tablet (800 mg total) by mouth 3 (three) times daily. 04/18/23  Yes Shemeika Starzyk, Lurena Joiner, PA-C    Family History Family History  Problem Relation Age of Onset   Stroke Father    Leukemia Maternal Aunt    Diabetes Paternal Uncle    Pneumonia Maternal Grandmother    Cancer Maternal Grandfather         lungs   Diabetes Paternal Grandmother    Diabetes Paternal Grandfather    Breast cancer Cousin    Diabetes Brother     Social History Social History   Tobacco Use   Smoking status: Never   Smokeless tobacco: Never  Vaping Use   Vaping status: Former  Substance Use Topics   Alcohol use: Yes    Comment: occasionally   Drug use: No     Allergies   Patient has no known allergies.   Review of Systems Review of Systems Per HPI  Physical Exam Triage Vital Signs ED Triage Vitals  Encounter Vitals Group     BP 04/18/23 1548 116/71     Systolic BP Percentile --      Diastolic BP Percentile --      Pulse Rate 04/18/23 1548 90     Resp 04/18/23 1548 18     Temp 04/18/23 1548 97.9 F (36.6 C)     Temp Source 04/18/23 1548 Oral     SpO2 04/18/23 1548 100 %  Weight --      Height --      Head Circumference --      Peak Flow --      Pain Score 04/18/23 1545 10     Pain Loc --      Pain Education --      Exclude from Growth Chart --    No data found.  Updated Vital Signs BP 116/71 (BP Location: Left Arm)   Pulse 90   Temp 97.9 F (36.6 C) (Oral)   Resp 18   LMP 03/28/2023   SpO2 100%   Physical Exam Vitals and nursing note reviewed.  Constitutional:      General: She is not in acute distress.    Appearance: Normal appearance.  Cardiovascular:     Rate and Rhythm: Normal rate and regular rhythm.     Pulses: Normal pulses.     Heart sounds: Normal heart sounds.  Pulmonary:     Effort: Pulmonary effort is normal.     Breath sounds: Normal breath sounds.  Skin:    Comments: Swelling around nailbed of left hand middle finger. Firm to touch, no fluctuance. No drainage. Nail intact  Neurological:     Mental Status: She is alert and oriented to person, place, and time.     UC Treatments / Results  Labs (all labs ordered are listed, but only abnormal results are displayed) Labs Reviewed - No data to display  EKG  Radiology No results  found.  Procedures Procedures  Medications Ordered in UC Medications - No data to display  Initial Impression / Assessment and Plan / UC Course  I have reviewed the triage vital signs and the nursing notes.  Pertinent labs & imaging results that were available during my care of the patient were reviewed by me and considered in my medical decision making (see chart for details).  Paronychia Augmentin BID x 7 days Ibuprofen and warm soaks  Advised not to attempt drainage at home. She used a needle and did not sanitize it first. Discussed she could introduce further bacteria into the area.  Can return here if needed A note for work is requested and provided  Final Clinical Impressions(s) / UC Diagnoses   Final diagnoses:  Paronychia of left middle finger     Discharge Instructions      Please take Augmentin as prescribed. Take with food to avoid upset stomach. Finish the FULL course of medicine --you should not have any leftover pills. It can take 2 to 3 days to start working  In the meantime you can use ibuprofen every 6 hours.  Soak the finger in clean warm water several times daily     ED Prescriptions     Medication Sig Dispense Auth. Provider   ibuprofen (ADVIL) 800 MG tablet Take 1 tablet (800 mg total) by mouth 3 (three) times daily. 21 tablet Miyu Fenderson, PA-C   amoxicillin-clavulanate (AUGMENTIN) 875-125 MG tablet Take 1 tablet by mouth every 12 (twelve) hours for 7 days. 14 tablet Ramsay Bognar, Lurena Joiner, PA-C      PDMP not reviewed this encounter.   Kennley Schwandt, Lurena Joiner, [place] 04/18/23 1623

## 2023-08-30 ENCOUNTER — Ambulatory Visit (HOSPITAL_COMMUNITY): Admission: EM | Admit: 2023-08-30 | Discharge: 2023-08-30 | Disposition: A | Discharge status: Home / Self Care

## 2023-08-30 ENCOUNTER — Encounter (HOSPITAL_COMMUNITY): Payer: Self-pay

## 2023-08-30 DIAGNOSIS — R519 Headache, unspecified: Secondary | ICD-10-CM

## 2023-08-30 DIAGNOSIS — Z0289 Encounter for other administrative examinations: Secondary | ICD-10-CM

## 2023-08-30 NOTE — ED Provider Notes (Addendum)
 [facility]    CSN: [id number] Arrival date & time: 08/30/23  1519      History   Chief Complaint No chief complaint on file.   HPI [name] [name] is a 43 y.o. female.   Patient presents requesting blood pressure medication and a work note.  Patient states last night she was having a bad headache with nausea and vomiting.  Patient states that she took 2 tablets of her boyfriend's blood pressure medication and her symptoms resolved.  Therefore patient believes that she also has high blood pressure and is requesting a prescription for blood pressure medication.  Patient reports a history of migraines and states that she normally takes [place] powder with relief.  Patient states that she did not take any [place]y powder last night.  Patient states that today she has not had any headache, nausea, or vomiting.  Patient also denies dizziness, weakness, numbness, confusion, slurred speech, chest pain, and shortness of breath.  Patient denies any history of high blood pressure.  Patient is requesting a work note to be out today stating that she did not go to work today because she was feeling unwell last night.  Patient denies any symptoms today.  The history is provided by the patient and medical records.    Past Medical History:  Diagnosis Date   Depression    ok now   Herpes    on valtrex    HSV-2 infection    Hx of migraines     Patient Active Problem List   Diagnosis Date Noted   Herpes simplex infection 09/18/2021   NVD (normal vaginal delivery) 05/01/2014   Indication for care in labor or delivery 04/30/2014    Past Surgical History:  Procedure Laterality Date   INDUCED ABORTION     3 elective   LAPAROSCOPIC TUBAL LIGATION Bilateral 05/29/2014   Procedure: LAPAROSCOPIC TUBAL LIGATION;  Surgeon: Aida DELENA Na, MD;  Location: [facility];  Service: Gynecology;  Laterality: Bilateral;   TUBAL LIGATION      OB History     Gravida  8   Para  5   Term  5    Preterm  0   AB  3   Living  5      SAB  0   IAB  3   Ectopic      Multiple  0   Live Births  5            Home Medications    Prior to Admission medications   Not on File    Family History Family History  Problem Relation Age of Onset   Stroke Father    Leukemia Maternal Aunt    Diabetes Paternal Uncle    Pneumonia Maternal Grandmother    Cancer Maternal Grandfather        lungs   Diabetes Paternal Grandmother    Diabetes Paternal Grandfather    Breast cancer Cousin    Diabetes Brother     Social History Social History   Tobacco Use   Smoking status: Never   Smokeless tobacco: Never  Vaping Use   Vaping status: Former  Substance Use Topics   Alcohol use: Yes    Comment: occasionally   Drug use: No     Allergies   Patient has no known allergies.   Review of Systems Review of Systems  Per HPI  Physical Exam Triage Vital Signs ED Triage Vitals [08/30/23 1529]  Encounter Vitals Group  BP 116/77     Girls Systolic BP Percentile      Girls Diastolic BP Percentile      Boys Systolic BP Percentile      Boys Diastolic BP Percentile      Pulse Rate 90     Resp 18     Temp 98.4 F (36.9 C)     Temp Source Oral     SpO2 97 %     Weight      Height      Head Circumference      Peak Flow      Pain Score      Pain Loc      Pain Education      Exclude from Growth Chart    No data found.  Updated Vital Signs BP 116/77 (BP Location: Left Arm)   Pulse 90   Temp 98.4 F (36.9 C) (Oral)   Resp 18   LMP 08/24/2023 (Approximate)   SpO2 97%   Visual Acuity Right Eye Distance:   Left Eye Distance:   Bilateral Distance:    Right Eye Near:   Left Eye Near:    Bilateral Near:     Physical Exam Vitals and nursing note reviewed.  Constitutional:      General: She is awake. She is not in acute distress.    Appearance: Normal appearance. She is well-developed and well-groomed. She is not ill-appearing.   Eyes:      Extraocular Movements: Extraocular movements intact.     Pupils: Pupils are equal, round, and reactive to light.    Skin:    General: Skin is warm and dry.   Neurological:     General: No focal deficit present.     Mental Status: She is alert and oriented to person, place, and time. Mental status is at baseline.     GCS: GCS eye subscore is 4. GCS verbal subscore is 5. GCS motor subscore is 6.     Cranial Nerves: Cranial nerves 2-12 are intact.     Sensory: Sensation is intact.     Motor: Motor function is intact.     Coordination: Coordination is intact.     Gait: Gait is intact.   Psychiatric:        Behavior: Behavior is cooperative.      UC Treatments / Results  Labs (all labs ordered are listed, but only abnormal results are displayed) Labs Reviewed - No data to display  EKG   Radiology No results found.  Procedures Procedures (including critical care time)  Medications Ordered in UC Medications - No data to display  Initial Impression / Assessment and Plan / UC Course  I have reviewed the triage vital signs and the nursing notes.  Pertinent labs & imaging results that were available during my care of the patient were reviewed by me and considered in my medical decision making (see chart for details).     Patient is well-appearing.  Vitals are stable.  Of note blood pressure is 116/77 in clinic today.  No significant findings upon exam.  No neurodeficits noted.  GCS 15.  Advised patient that she does not need blood pressure medication at this time due to blood pressure being normal in clinic.  Discussed with patient that it is difficult to determine if she does have underlying high blood pressure due to her taking her boyfriend's blood pressure medication.  Discussed with patient that headaches can be caused from a variety of things and  is not always related to blood pressure.  Patient is understanding of this.  Given patient resources to follow-up with a  primary care provider regarding further evaluation to determine if she needs blood pressure medication.  Provided patient with work note. Final Clinical Impressions(s) / UC Diagnoses   Final diagnoses:  Bad headache  Encounter to obtain excuse from work     Discharge Instructions      Follow-up with [facility] or Dr. Rexanne who is listed as your primary care provider for further evaluation to determine if you need blood pressure medication. Alternate between 650 mg of Tylenol  and 400 mg of ibuprofen  as needed for any headache.  Make sure you are staying hydrated and getting plenty of rest. Return here as needed    ED Prescriptions   None    PDMP not reviewed this encounter.   Johnie Rumaldo LABOR, NP 08/30/23 1631    Johnie Rumaldo LABOR, NP 08/30/23 1632    Johnie Rumaldo A, NP 08/30/23 [phone]

## 2023-08-30 NOTE — Discharge Instructions (Signed)
 Follow-up with [facility] or Dr. Rexanne who is listed as your primary care provider for further evaluation to determine if you need blood pressure medication. Alternate between 650 mg of Tylenol  and 400 mg of ibuprofen  as needed for any headache.  Make sure you are staying hydrated and getting plenty of rest. Return here as needed

## 2023-08-30 NOTE — ED Triage Notes (Signed)
 Patient presents to the office for headache and vomiting that started this morning. Patient states she feels better and will need a work excuse. Patient also reports she needs blood pressure medication. Patient has been taking her boyfriend medication.

## 2023-09-14 ENCOUNTER — Ambulatory Visit (HOSPITAL_COMMUNITY)
Admission: EM | Admit: 2023-09-14 | Discharge: 2023-09-14 | Disposition: A | Discharge status: Home / Self Care | Attending: Physician Assistant | Admitting: Physician Assistant

## 2023-09-14 ENCOUNTER — Other Ambulatory Visit: Payer: Self-pay

## 2023-09-14 ENCOUNTER — Encounter (HOSPITAL_COMMUNITY): Payer: Self-pay | Admitting: *Deleted

## 2023-09-14 ENCOUNTER — Other Ambulatory Visit (HOSPITAL_COMMUNITY): Payer: Self-pay

## 2023-09-14 DIAGNOSIS — G43009 Migraine without aura, not intractable, without status migrainosus: Secondary | ICD-10-CM

## 2023-09-14 MED ORDER — SUMATRIPTAN SUCCINATE 50 MG PO TABS
50.0000 mg | ORAL_TABLET | ORAL | 0 refills | Status: AC
  Filled 2023-09-14 – 2023-10-05 (×2): qty 5, 5d supply, fill #0

## 2023-09-14 MED ORDER — SUMATRIPTAN SUCCINATE 50 MG PO TABS: ORAL_TABLET | ORAL | 0 refills | Status: DC

## 2023-09-14 NOTE — Discharge Instructions (Signed)
 I have sent in Imitrex  to the pharmacy.  Take the first tablet as soon as you pick up the medication and you can take a second tablet 2 hours later if your symptoms persist.  Do not take more than 2 tablets in 24 hours.  Make sure that you keep the appointment scheduled with primary care later this month as you may benefit from restarting the medication you take every day to prevent headaches.  Make sure you are drinking plenty of fluid.  If anything worsens and your headache is not improving with this medication, it becomes the worst headache of your life, you develop nausea and vomiting, you have vision change or dizziness, weakness in part of your body you need to go to the ER immediately.

## 2023-09-14 NOTE — ED Provider Notes (Signed)
 [facility]    CSN: [id number] Arrival date & time: 09/14/23  1000      History   Chief Complaint Chief Complaint  Patient presents with   Headache    HPI [name] [name] is a 43 y.o. female.   Patient presents today with a 24-hour history of headache.  She has a history of migraines and states current symptoms are similar to previous episodes of this condition.  This is not the worst headache of her life.  She was previously on prophylactic medication but has not taken this as she is not currently followed by primary care though she does have an appointment to schedule with them 09/27/2023.  Denies any recent medication change, head injury, recent illness or additional symptoms including cough or congestion.  Denies any associated nausea/vomiting.  Denies any visual disturbance or dysarthria.  She has tried [place] powders without improvement of symptoms.  Reports that she was previously prescribed Imitrex  for abortive therapy but has not had this prescription recently.  She is requesting a refill of this as this is generally effective in managing her symptoms.  She denies any history of cardiovascular disease, hypertension, chronic kidney disease.  She is also requesting a work excuse note to return tomorrow.  She has no concern for pregnancy.  She reports that pain is rated 7 on a certain pain scale, localized to bitemporal region, worse with activity, no alleviating factors identified, described as throbbing.  She reports associated photophobia.    Past Medical History:  Diagnosis Date   Depression    ok now   Herpes    on valtrex    HSV-2 infection    Hx of migraines     Patient Active Problem List   Diagnosis Date Noted   Herpes simplex infection 09/18/2021   NVD (normal vaginal delivery) 05/01/2014   Indication for care in labor or delivery 04/30/2014    Past Surgical History:  Procedure Laterality Date   INDUCED ABORTION     3 elective   LAPAROSCOPIC  TUBAL LIGATION Bilateral 05/29/2014   Procedure: LAPAROSCOPIC TUBAL LIGATION;  Surgeon: Aida DELENA Na, MD;  Location: [facility];  Service: Gynecology;  Laterality: Bilateral;   TUBAL LIGATION      OB History     Gravida  8   Para  5   Term  5   Preterm  0   AB  3   Living  5      SAB  0   IAB  3   Ectopic      Multiple  0   Live Births  5            Home Medications    Prior to Admission medications   Medication Sig Start Date End Date Taking? Authorizing Provider  SUMAtriptan  (IMITREX ) 50 MG tablet Take 1 tablet at onset of headache.  Can take a second tablet 2 hours after first dose if symptoms persist.  Do not take more than 2 doses in 24 hours. 09/14/23   Sigifredo Pignato, Rocky POUR, PA-C    Family History Family History  Problem Relation Age of Onset   Stroke Father    Leukemia Maternal Aunt    Diabetes Paternal Uncle    Pneumonia Maternal Grandmother    Cancer Maternal Grandfather        lungs   Diabetes Paternal Grandmother    Diabetes Paternal Grandfather    Breast cancer Cousin    Diabetes Brother  Social History Social History   Tobacco Use   Smoking status: Never   Smokeless tobacco: Never  Vaping Use   Vaping status: Former  Substance Use Topics   Alcohol use: Yes    Comment: occasionally   Drug use: No     Allergies   Patient has no known allergies.   Review of Systems Review of Systems  Constitutional:  Positive for activity change. Negative for appetite change, fatigue and fever.  HENT:  Negative for congestion and sore throat.   Eyes:  Positive for photophobia. Negative for visual disturbance.  Respiratory:  Negative for cough.   Gastrointestinal:  Negative for abdominal pain, diarrhea, nausea and vomiting.  Neurological:  Positive for headaches. Negative for dizziness, seizures, syncope, facial asymmetry, speech difficulty, weakness and light-headedness.     Physical Exam Triage Vital Signs ED Triage Vitals  Encounter  Vitals Group     BP 09/14/23 1035 117/80     Girls Systolic BP Percentile --      Girls Diastolic BP Percentile --      Boys Systolic BP Percentile --      Boys Diastolic BP Percentile --      Pulse Rate 09/14/23 1035 76     Resp 09/14/23 1035 16     Temp 09/14/23 1035 98.4 F (36.9 C)     Temp Source 09/14/23 1035 Oral     SpO2 09/14/23 1035 100 %     Weight --      Height --      Head Circumference --      Peak Flow --      Pain Score 09/14/23 1037 5     Pain Loc --      Pain Education --      Exclude from Growth Chart --    No data found.  Updated Vital Signs BP 117/80   Pulse 76   Temp 98.4 F (36.9 C) (Oral)   Resp 16   LMP 09/10/2023 (Exact Date)   SpO2 100%   Visual Acuity Right Eye Distance:   Left Eye Distance:   Bilateral Distance:    Right Eye Near:   Left Eye Near:    Bilateral Near:     Physical Exam Vitals reviewed.  Constitutional:      General: She is awake. She is not in acute distress.    Appearance: Normal appearance. She is well-developed. She is not ill-appearing.     Comments: Very pleasant female appears stated age no acute distress sitting comfortably in exam room  HENT:     Head: Normocephalic and atraumatic. No raccoon eyes, Battle's sign or contusion.     Right Ear: Tympanic membrane, ear canal and external ear normal. No hemotympanum.     Left Ear: Tympanic membrane, ear canal and external ear normal. No hemotympanum.     Nose: Nose normal.     Mouth/Throat:     Tongue: Tongue does not deviate from midline.     Pharynx: Uvula midline. No oropharyngeal exudate or posterior oropharyngeal erythema.  Eyes:     Extraocular Movements: Extraocular movements intact.     Pupils: Pupils are equal, round, and reactive to light.  Cardiovascular:     Rate and Rhythm: Normal rate and regular rhythm.     Heart sounds: Normal heart sounds, S1 normal and S2 normal. No murmur heard. Pulmonary:     Effort: Pulmonary effort is normal.     Breath  sounds: Normal breath sounds. No wheezing,  rhonchi or rales.     Comments: Clear to auscultation bilaterally Musculoskeletal:     Comments: Strength 5/5 bilateral upper and lower extremities  Neurological:     General: No focal deficit present.     Mental Status: She is alert and oriented to person, place, and time.     Cranial Nerves: Cranial nerves 2-12 are intact.     Motor: Motor function is intact.     Coordination: Coordination is intact.     Gait: Gait is intact.     Comments: Cranial nerves II through XII grossly intact.  No focal neurological defect on exam.  Psychiatric:        Behavior: Behavior is cooperative.      UC Treatments / Results  Labs (all labs ordered are listed, but only abnormal results are displayed) Labs Reviewed - No data to display  EKG   Radiology No results found.  Procedures Procedures (including critical care time)  Medications Ordered in UC Medications - No data to display  Initial Impression / Assessment and Plan / UC Course  I have reviewed the triage vital signs and the nursing notes.  Pertinent labs & imaging results that were available during my care of the patient were reviewed by me and considered in my medical decision making (see chart for details).     Patient is well-appearing, afebrile, nontoxic, nontachycardic.  Vital signs and physical exam are reassuring with no indication for emergent evaluation or imaging; patient denies SNOOP symptoms.  I offered her injections of Toradol  and Decadron  in clinic to help manage acute symptoms but she declined this.  She preferred to restart her Imitrex  as this is generally been effective in abortive care.  This was sent to her pharmacy with instruction to take tablet as soon as she picks up the prescription and then a second tablet 2 hours later if needed.  We discussed that if her symptoms are not improving within a few days of starting this medication or if anything worsens and she has the  worst headache of her life, associated nausea/vomiting, visual disturbance, weakness, dysarthria she needs to be seen emergently.  She is scheduled to establish with primary care later this month and we strongly encouraged to keep this appointment as she may benefit from reinitiation of prophylactic medication given headache episodes occurring several times per month and discussed that this is not something we typically do in urgent care.  Strict return precautions were given.  Excuse note was provided.  Final Clinical Impressions(s) / UC Diagnoses   Final diagnoses:  Migraine without aura and without status migrainosus, not intractable     Discharge Instructions      I have sent in Imitrex  to the pharmacy.  Take the first tablet as soon as you pick up the medication and you can take a second tablet 2 hours later if your symptoms persist.  Do not take more than 2 tablets in 24 hours.  Make sure that you keep the appointment scheduled with primary care later this month as you may benefit from restarting the medication you take every day to prevent headaches.  Make sure you are drinking plenty of fluid.  If anything worsens and your headache is not improving with this medication, it becomes the worst headache of your life, you develop nausea and vomiting, you have vision change or dizziness, weakness in part of your body you need to go to the ER immediately.     ED Prescriptions  Medication Sig Dispense Auth. Provider   SUMAtriptan  (IMITREX ) 50 MG tablet  (Status: Discontinued) Take 1 tablet at onset of headache.  Can take a second tablet 2 hours after first dose if symptoms persist.  Do not take more than 2 doses in 24 hours. 5 tablet Angelique Chevalier K, PA-C   SUMAtriptan  (IMITREX ) 50 MG tablet Take 1 tablet at onset of headache.  Can take a second tablet 2 hours after first dose if symptoms persist.  Do not take more than 2 doses in 24 hours. 5 tablet Dominyck Reser K, PA-C      PDMP not  reviewed this encounter.   Sherrell Rocky POUR, PA-C 09/14/23 1123

## 2023-09-14 NOTE — ED Triage Notes (Addendum)
 C/O migraine onset last night. Had some nausea, which has resolved. Denies any vision changes. Has not taken any meds to help with pain.

## 2023-09-16 ENCOUNTER — Other Ambulatory Visit (HOSPITAL_COMMUNITY): Payer: Self-pay

## 2023-09-26 ENCOUNTER — Other Ambulatory Visit (HOSPITAL_COMMUNITY): Payer: Self-pay

## 2023-10-05 ENCOUNTER — Other Ambulatory Visit (HOSPITAL_COMMUNITY): Payer: Self-pay

## 2023-10-28 ENCOUNTER — Other Ambulatory Visit: Payer: Self-pay | Admitting: Family

## 2023-10-28 DIAGNOSIS — B009 Herpesviral infection, unspecified: Secondary | ICD-10-CM

## 2023-11-11 ENCOUNTER — Other Ambulatory Visit: Payer: Self-pay | Admitting: Family Medicine

## 2023-11-11 DIAGNOSIS — Z1231 Encounter for screening mammogram for malignant neoplasm of breast: Secondary | ICD-10-CM

## 2023-11-25 ENCOUNTER — Telehealth: Payer: Self-pay

## 2023-11-25 NOTE — Telephone Encounter (Signed)
 Patient walked in requesting appointment. She needs a refill of Valtrex , but cannot see her PCP due to an outstanding bill.   Shubham Thackston, BSN, RN

## 2023-11-30 ENCOUNTER — Other Ambulatory Visit: Payer: Self-pay | Admitting: Internal Medicine

## 2023-11-30 ENCOUNTER — Ambulatory Visit
Admission: RE | Admit: 2023-11-30 | Discharge: 2023-11-30 | Disposition: A | Source: Ambulatory Visit | Attending: Family Medicine | Admitting: Family Medicine

## 2023-11-30 DIAGNOSIS — Z1231 Encounter for screening mammogram for malignant neoplasm of breast: Secondary | ICD-10-CM

## 2023-12-06 ENCOUNTER — Ambulatory Visit: Admitting: Family

## 2024-01-24 ENCOUNTER — Ambulatory Visit (HOSPITAL_COMMUNITY)
Admission: RE | Admit: 2024-01-24 | Discharge: 2024-01-24 | Disposition: A | Discharge status: Home / Self Care | Source: Ambulatory Visit | Attending: Family Medicine | Admitting: Family Medicine

## 2024-01-24 ENCOUNTER — Encounter (HOSPITAL_COMMUNITY): Payer: Self-pay

## 2024-01-24 VITALS — BP 116/68 | HR 82 | Temp 99.1°F | Resp 18 | Ht 67.0 in | Wt 130.0 lb

## 2024-01-24 DIAGNOSIS — J029 Acute pharyngitis, unspecified: Secondary | ICD-10-CM | POA: Insufficient documentation

## 2024-01-24 DIAGNOSIS — R051 Acute cough: Secondary | ICD-10-CM | POA: Diagnosis present

## 2024-01-24 LAB — POCT RAPID STREP A (OFFICE): Rapid Strep A Screen: NEGATIVE

## 2024-01-24 MED ORDER — HYDROCODONE BIT-HOMATROP MBR 5-1.5 MG/5ML PO SOLN: 5.0000 mL | Freq: Four times a day (QID) | ORAL | 0 refills | Status: AC | PRN

## 2024-01-24 NOTE — ED Triage Notes (Signed)
 Pt st's her son had strep throat and she started getting a sore throat 3 days ago with a cough

## 2024-01-24 NOTE — Discharge Instructions (Addendum)
Be aware, your cough medication may cause drowsiness. Please do not drive, operate heavy machinery or make important decisions while on this medication, it can cloud your judgement.  You may use over the counter ibuprofen or acetaminophen as needed.  For a sore throat, over the counter products such as [employer] Peroxyl Mouth Sore Rinse or Chloraseptic Sore Throat Spray may provide some temporary relief. Your rapid strep test was negative today. We have sent your throat swab for culture and will let you know of any positive results.

## 2024-01-24 NOTE — ED Provider Notes (Signed)
 [facility]   [id number] 01/24/24 Arrival Time: 1426  ASSESSMENT & PLAN:  1. Sore throat   2. Acute cough     No signs of peritonsillar abscess. Discussed.  Meds ordered this encounter  Medications   HYDROcodone  bit-homatropine (HYCODAN) 5-1.5 MG/5ML syrup    Sig: Take 5 mLs by mouth every 6 (six) hours as needed for cough.    Dispense:  90 mL    Refill:  0    Results for orders placed or performed during the hospital encounter of 01/24/24  POC rapid strep A   Collection Time: 01/24/24  3:48 PM  Result Value Ref Range   Rapid Strep A Screen Negative Negative   Labs Reviewed  POCT RAPID STREP A (OFFICE)    OTC analgesics and throat care as needed Work note provided.    Discharge Instructions      Be aware, your cough medication may cause drowsiness. Please do not drive, operate heavy machinery or make important decisions while on this medication, it can cloud your judgement.  You may use over the counter ibuprofen  or acetaminophen  as needed.  For a sore throat, over the counter products such as [employer] Peroxyl Mouth Sore Rinse or Chloraseptic Sore Throat Spray may provide some temporary relief. Your rapid strep test was negative today. We have sent your throat swab for culture and will let you know of any positive results.      Reviewed expectations re: course of current medical issues. Questions answered. Outlined signs and symptoms indicating need for more acute intervention. Patient verbalized understanding. After Visit Summary given.   SUBJECTIVE:  [name] [name] is a 43 y.o. female who reports a sore throat. Pt st's her son had strep throat and she started getting a sore throat 3 days ago with a cough. Tolerating PO intake. Denies fever.    OBJECTIVE:  Vitals:   01/24/24 1505 01/24/24 1506  BP: 116/68   Pulse: 82   Resp: 18   Temp: 99.1 F (37.3 C)   TempSrc: Oral   SpO2: 99%   Weight:  59 kg  Height:  5' 7 (1.702 m)      General appearance: alert; no distress HEENT: throat with moderate erythema and cobblestoning; uvula is midline Neck: supple with FROM; no lymphadenopathy Lungs: speaks full sentences without difficulty; unlabored; dry cough; CTAB Abd: soft; non-tender Skin: reveals no rash; warm and dry Psychological: alert and cooperative; normal mood and affect  No Known Allergies  Past Medical History:  Diagnosis Date   Depression    ok now   Herpes    on valtrex    HSV-2 infection    Hx of migraines    Social History   Socioeconomic History   Marital status: Single    Spouse name: Not on file   Number of children: 5   Years of education: 12   Highest education level: Not on file  Occupational History   Not on file  Tobacco Use   Smoking status: Never   Smokeless tobacco: Never  Vaping Use   Vaping status: Former  Substance and Sexual Activity   Alcohol use: Yes    Comment: occasionally   Drug use: No   Sexual activity: Not Currently    Partners: Male    Birth control/protection: Surgical  Other Topics Concern   Not on file  Social History Narrative   Not on file   Social Drivers of Health   Financial Resource Strain: Not on file  Food  Insecurity: Not on file  Transportation Needs: Not on file  Physical Activity: Not on file  Stress: Not on file  Social Connections: Unknown (07/14/2021)   Received from [facility]   Social Network    Social Network: Not on file  Intimate Partner Violence: Unknown (06/05/2021)   Received from [facility]   HITS    Physically Hurt: Not on file    Insult or Talk Down To: Not on file    Threaten Physical Harm: Not on file    Scream or Curse: Not on file   Family History  Problem Relation Age of Onset   Stroke Father    Leukemia Maternal Aunt    Diabetes Paternal Uncle    Pneumonia Maternal Grandmother    Cancer Maternal Grandfather        lungs   Diabetes Paternal Grandmother    Diabetes Paternal Grandfather    Breast  cancer Cousin    Diabetes Brother            Rolinda Rogue, MD 01/24/24 [phone]

## 2024-01-26 LAB — CULTURE, GROUP A STREP (THRC)

## 2024-01-30 ENCOUNTER — Ambulatory Visit (HOSPITAL_COMMUNITY): Payer: Self-pay

## 2024-05-20 IMAGING — MG MM DIGITAL SCREENING BILAT W/ TOMO AND CAD
8 series · 9 of 24 positions shown · non-contrast
Comparison: None.

CLINICAL DATA: Screening. This is the patient's initial baseline
mammogram.

EXAM:
DIGITAL SCREENING BILATERAL MAMMOGRAM WITH TOMOSYNTHESIS AND CAD
TECHNIQUE: Bilateral screening digital craniocaudal and mediolateral oblique
mammograms were obtained. Bilateral screening digital breast
tomosynthesis was performed. The images were evaluated with
computer-aided detection.

[L CC synth-2D]
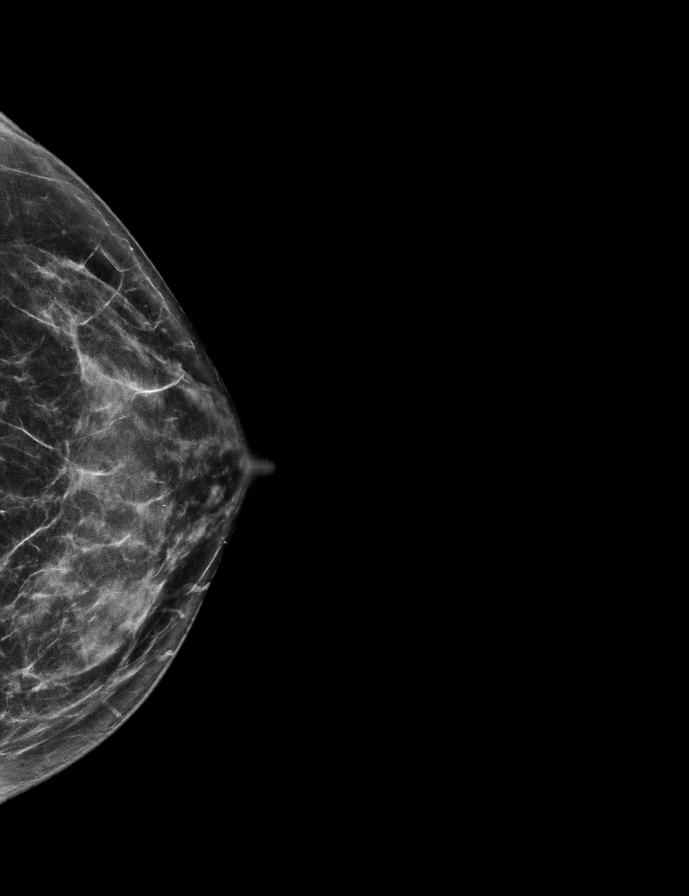

[L MLO synth-2D]
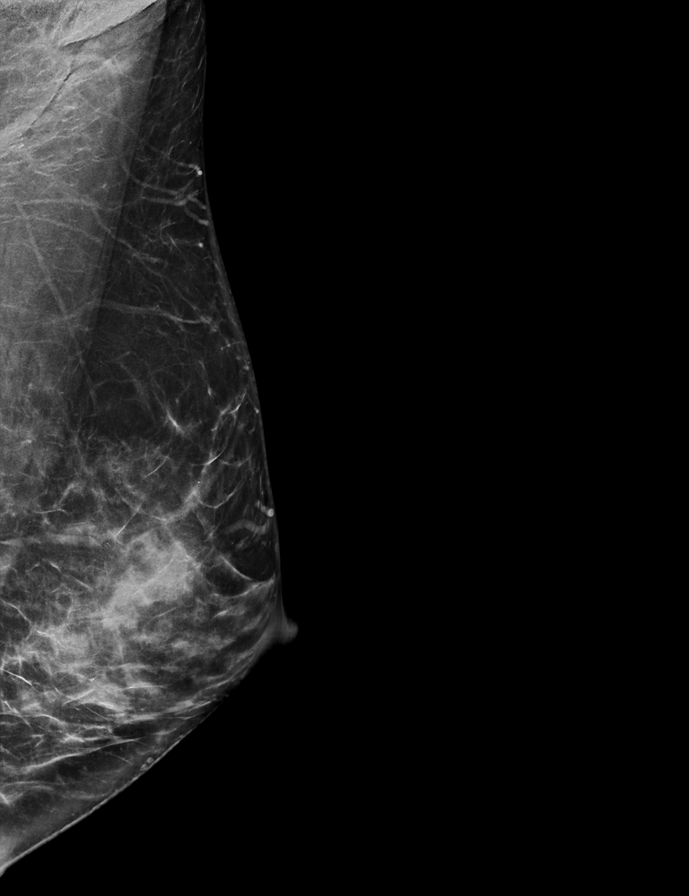

[R MLO synth-2D]
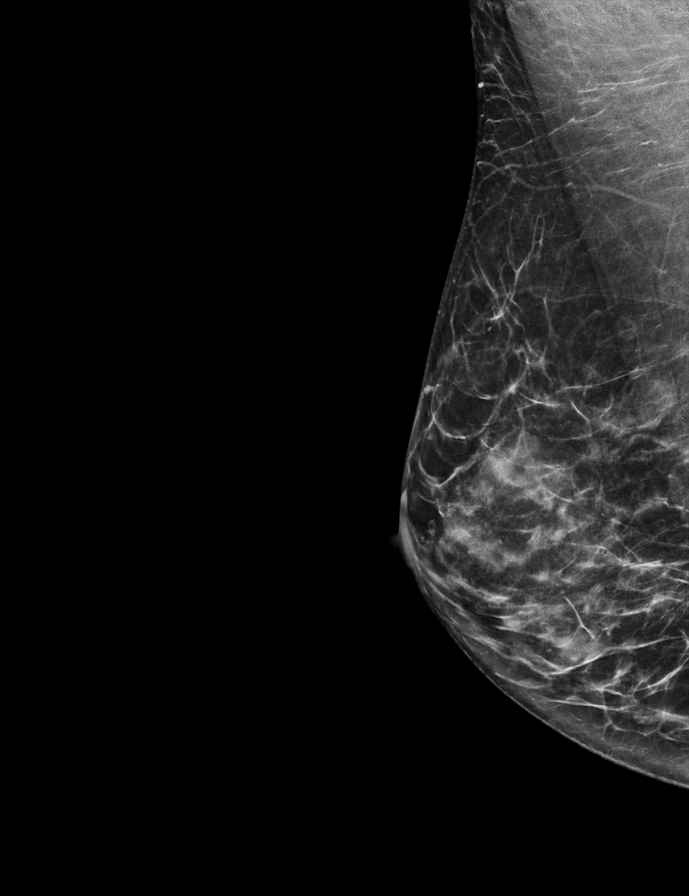

[R CC synth-2D]
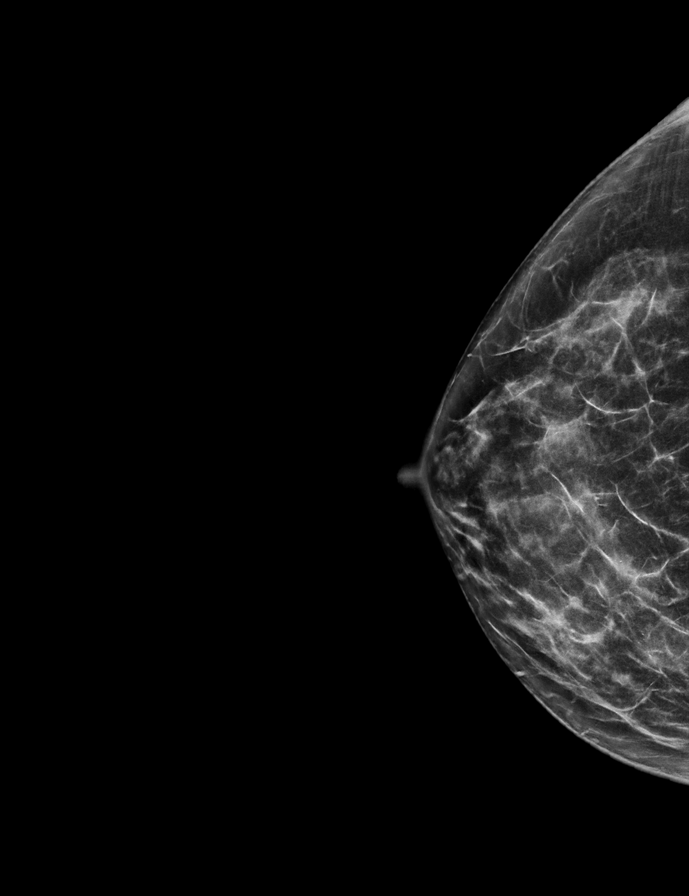

[L CC tomo · 2 of 61 frames shown]
[frame 20/61]
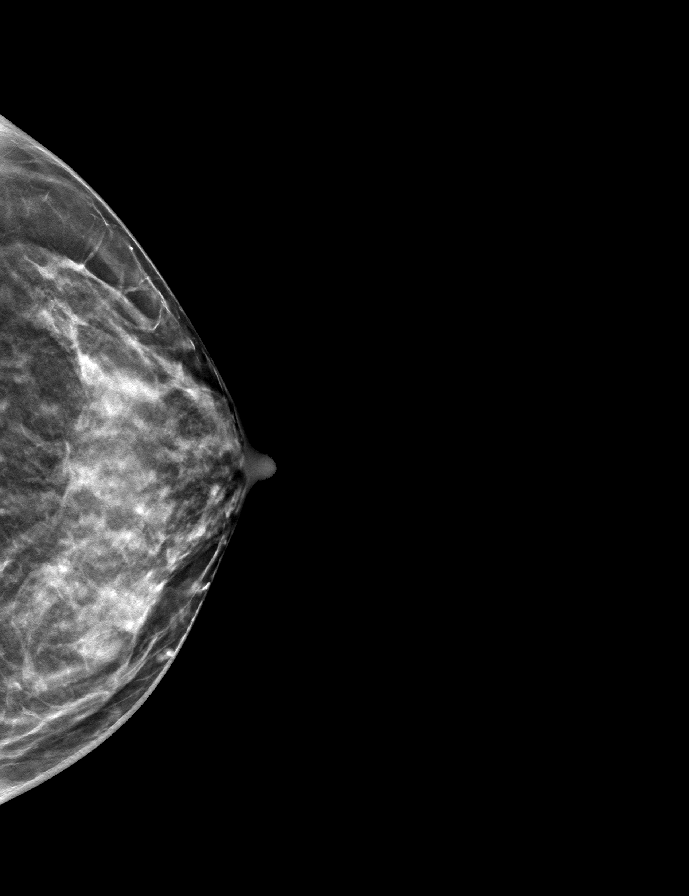
[frame 31/61]
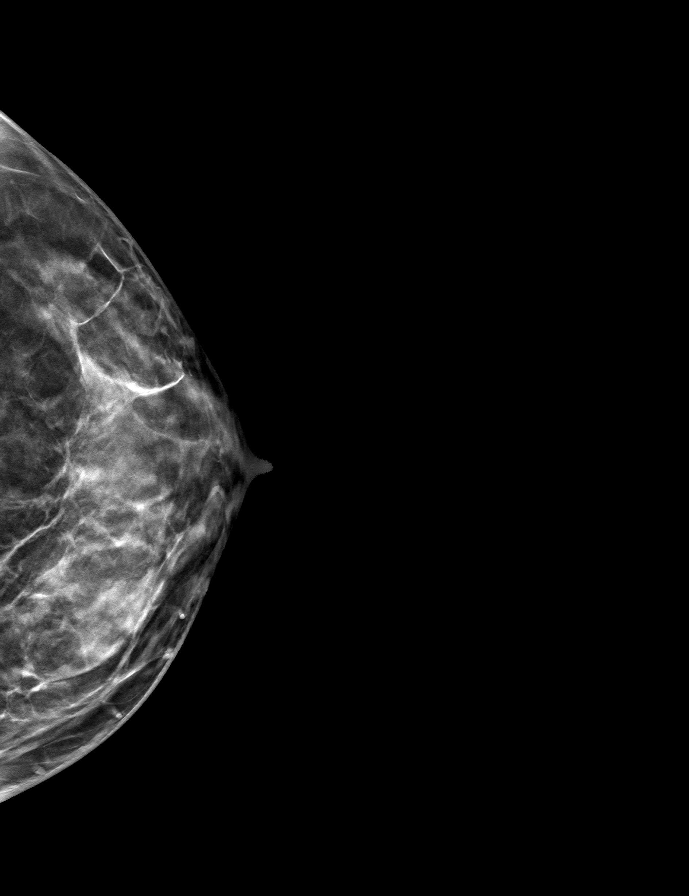

[R CC tomo · tomo slice 29/57.0]
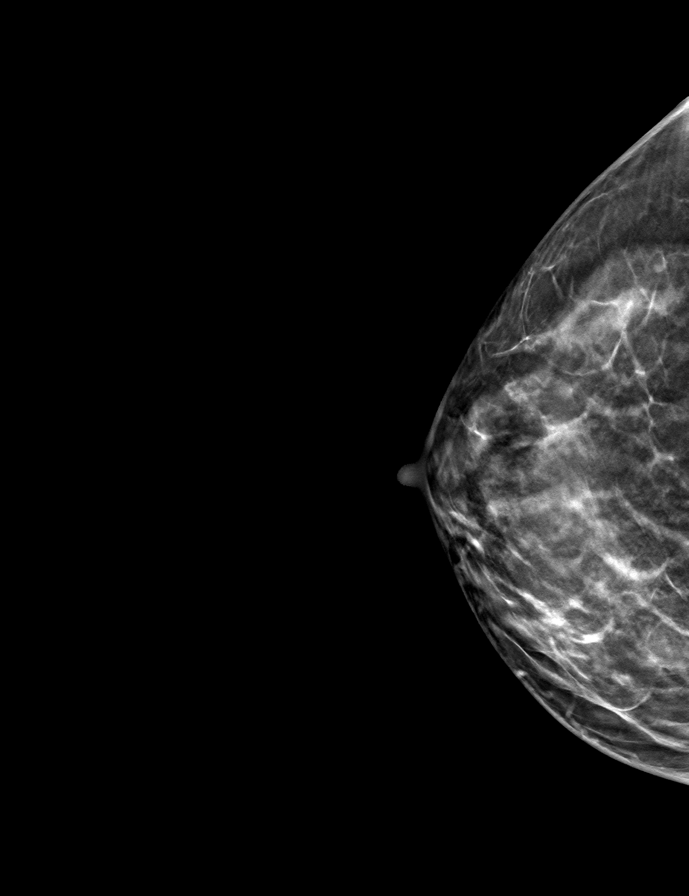

[R MLO tomo · tomo slice 29/58.0]
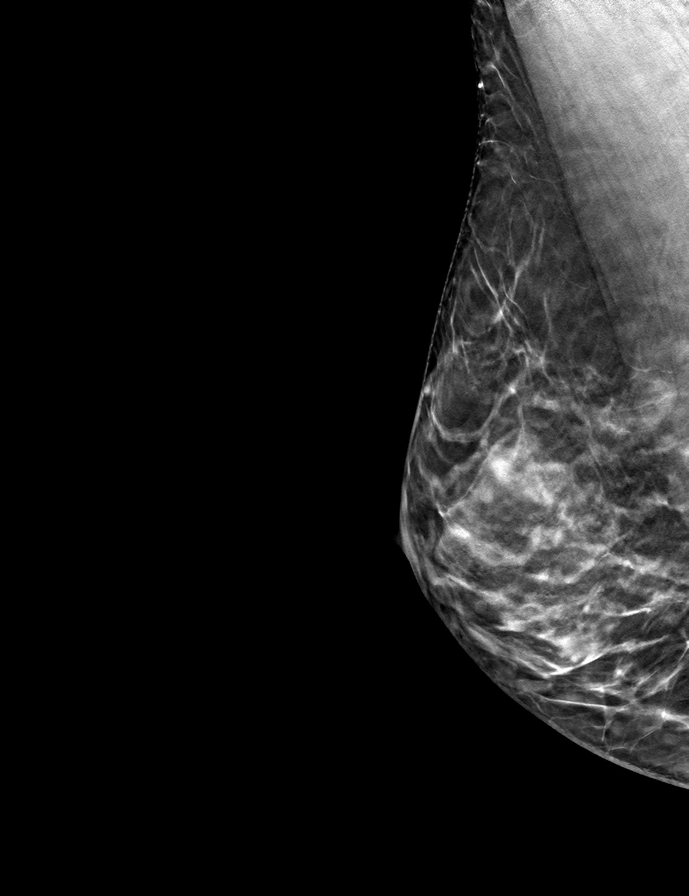

[L MLO tomo · tomo slice 33/65.0]
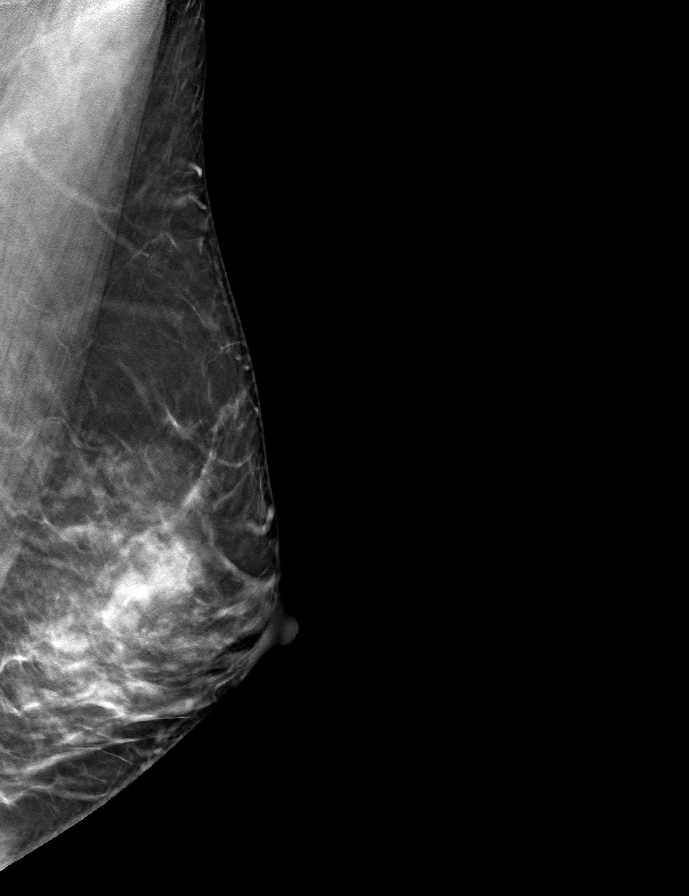

[9 of 24 positions shown; findings below may reference images not displayed]

ACR Breast Density Category c: The breast tissue is heterogeneously
dense, which may obscure small masses
FINDINGS: There are no findings suspicious for malignancy.
IMPRESSION: No mammographic evidence of malignancy. A result letter of this
screening mammogram will be mailed directly to the patient.

RECOMMENDATION:
Screening mammogram in one year. (Code:TB-C-WON)

BI-RADS CATEGORY  1: Negative.
